# Patient Record
Sex: Male | Born: 1960 | Race: White | Hispanic: No | Smoking: Current every day smoker
Health system: Southern US, Community
[De-identification: ages and names within clinical notes are randomized; demographics above are authoritative.]

## PROBLEM LIST (undated history)

## (undated) DIAGNOSIS — F32A Depression, unspecified: Secondary | ICD-10-CM

---

## 2018-01-12 DIAGNOSIS — U071 COVID-19: Secondary | ICD-10-CM

## 2018-01-12 HISTORY — DX: COVID-19: U07.1

## 2019-01-29 DIAGNOSIS — U071 COVID-19: Secondary | ICD-10-CM | POA: Diagnosis not present

## 2019-02-03 DIAGNOSIS — R05 Cough: Secondary | ICD-10-CM | POA: Diagnosis not present

## 2019-02-03 DIAGNOSIS — U071 COVID-19: Secondary | ICD-10-CM | POA: Diagnosis not present

## 2019-02-03 DIAGNOSIS — R432 Parageusia: Secondary | ICD-10-CM | POA: Diagnosis not present

## 2019-02-03 DIAGNOSIS — R439 Unspecified disturbances of smell and taste: Secondary | ICD-10-CM | POA: Diagnosis not present

## 2019-04-10 ENCOUNTER — Other Ambulatory Visit: Payer: Self-pay

## 2019-04-10 ENCOUNTER — Emergency Department (HOSPITAL_COMMUNITY): Payer: BC Managed Care – PPO

## 2019-04-10 ENCOUNTER — Emergency Department (HOSPITAL_COMMUNITY)
Admission: EM | Admit: 2019-04-10 | Discharge: 2019-04-11 | Disposition: A | Discharge status: Home / Self Care | Payer: BC Managed Care – PPO | Attending: Emergency Medicine | Admitting: Emergency Medicine

## 2019-04-10 ENCOUNTER — Encounter (HOSPITAL_COMMUNITY): Payer: Self-pay

## 2019-04-10 DIAGNOSIS — R55 Syncope and collapse: Secondary | ICD-10-CM | POA: Diagnosis not present

## 2019-04-10 DIAGNOSIS — F17210 Nicotine dependence, cigarettes, uncomplicated: Secondary | ICD-10-CM | POA: Diagnosis not present

## 2019-04-10 DIAGNOSIS — R410 Disorientation, unspecified: Secondary | ICD-10-CM | POA: Diagnosis not present

## 2019-04-10 DIAGNOSIS — F10920 Alcohol use, unspecified with intoxication, uncomplicated: Secondary | ICD-10-CM | POA: Insufficient documentation

## 2019-04-10 DIAGNOSIS — R000 Tachycardia, unspecified: Secondary | ICD-10-CM | POA: Diagnosis not present

## 2019-04-10 DIAGNOSIS — R4182 Altered mental status, unspecified: Secondary | ICD-10-CM | POA: Diagnosis not present

## 2019-04-10 DIAGNOSIS — Y906 Blood alcohol level of 120-199 mg/100 ml: Secondary | ICD-10-CM | POA: Insufficient documentation

## 2019-04-10 DIAGNOSIS — I10 Essential (primary) hypertension: Secondary | ICD-10-CM | POA: Diagnosis not present

## 2019-04-10 DIAGNOSIS — G9340 Encephalopathy, unspecified: Secondary | ICD-10-CM | POA: Diagnosis not present

## 2019-04-10 DIAGNOSIS — F10129 Alcohol abuse with intoxication, unspecified: Secondary | ICD-10-CM | POA: Diagnosis not present

## 2019-04-10 LAB — CBC WITH DIFFERENTIAL/PLATELET
Abs Immature Granulocytes: 0.01 10*3/uL (ref 0.00–0.07)
Basophils Absolute: 0 10*3/uL (ref 0.0–0.1)
Basophils Relative: 1 %
Eosinophils Absolute: 0.1 10*3/uL (ref 0.0–0.5)
Eosinophils Relative: 2 %
HCT: 45.7 % (ref 39.0–52.0)
Hemoglobin: 15.7 g/dL (ref 13.0–17.0)
Immature Granulocytes: 0 %
Lymphocytes Relative: 31 %
Lymphs Abs: 1.1 10*3/uL (ref 0.7–4.0)
MCH: 35.2 pg — ABNORMAL HIGH (ref 26.0–34.0)
MCHC: 34.4 g/dL (ref 30.0–36.0)
MCV: 102.5 fL — ABNORMAL HIGH (ref 80.0–100.0)
Monocytes Absolute: 0.6 10*3/uL (ref 0.1–1.0)
Monocytes Relative: 16 %
Neutro Abs: 1.8 10*3/uL (ref 1.7–7.7)
Neutrophils Relative %: 50 %
Platelets: 189 10*3/uL (ref 150–400)
RBC: 4.46 MIL/uL (ref 4.22–5.81)
RDW: 12.5 % (ref 11.5–15.5)
WBC: 3.6 10*3/uL — ABNORMAL LOW (ref 4.0–10.5)
nRBC: 0 % (ref 0.0–0.2)

## 2019-04-10 LAB — APTT: aPTT: 23 seconds — ABNORMAL LOW (ref 24–36)

## 2019-04-10 LAB — RAPID URINE DRUG SCREEN, HOSP PERFORMED
Amphetamines: NOT DETECTED
Barbiturates: NOT DETECTED
Benzodiazepines: NOT DETECTED
Cocaine: NOT DETECTED
Opiates: NOT DETECTED
Tetrahydrocannabinol: NOT DETECTED

## 2019-04-10 LAB — ETHANOL: Alcohol, Ethyl (B): 137 mg/dL — ABNORMAL HIGH (ref <10)

## 2019-04-10 LAB — COMPREHENSIVE METABOLIC PANEL
ALT: 33 U/L (ref 0–44)
AST: 51 U/L — ABNORMAL HIGH (ref 15–41)
Albumin: 3.9 g/dL (ref 3.5–5.0)
Alkaline Phosphatase: 64 U/L (ref 38–126)
Anion gap: 12 (ref 5–15)
BUN: 8 mg/dL (ref 6–20)
CO2: 24 mmol/L (ref 22–32)
Calcium: 9.9 mg/dL (ref 8.9–10.3)
Chloride: 104 mmol/L (ref 98–111)
Creatinine, Ser: 0.75 mg/dL (ref 0.61–1.24)
GFR calc Af Amer: >60 mL/min (ref >60)
GFR calc non Af Amer: >60 mL/min (ref >60)
Glucose, Bld: 125 mg/dL — ABNORMAL HIGH (ref 70–99)
Potassium: 4.1 mmol/L (ref 3.5–5.1)
Sodium: 140 mmol/L (ref 135–145)
Total Bilirubin: 0.9 mg/dL (ref 0.3–1.2)
Total Protein: 7.1 g/dL (ref 6.5–8.1)

## 2019-04-10 LAB — CBG MONITORING, ED: Glucose-Capillary: 118 mg/dL — ABNORMAL HIGH (ref 70–99)

## 2019-04-10 LAB — URINALYSIS, ROUTINE W REFLEX MICROSCOPIC
Bilirubin Urine: NEGATIVE
Glucose, UA: NEGATIVE mg/dL
Hgb urine dipstick: NEGATIVE
Ketones, ur: NEGATIVE mg/dL
Leukocytes,Ua: NEGATIVE
Nitrite: NEGATIVE
Protein, ur: NEGATIVE mg/dL
Specific Gravity, Urine: 1.021 (ref 1.005–1.030)
pH: 6 (ref 5.0–8.0)

## 2019-04-10 LAB — PROTIME-INR
INR: 1 (ref 0.8–1.2)
Prothrombin Time: 13.1 seconds (ref 11.4–15.2)

## 2019-04-10 LAB — AMMONIA: Ammonia: 33 umol/L (ref 9–35)

## 2019-04-10 MED ORDER — THIAMINE HCL 100 MG/ML IJ SOLN
100.0000 mg | Freq: Once | INTRAMUSCULAR | Status: AC
  Administered 2019-04-10: 16:00:00 100 mg via INTRAVENOUS
  Filled 2019-04-10: qty 2

## 2019-04-10 MED ORDER — SODIUM CHLORIDE 0.9 % IV BOLUS
1000.0000 mL | Freq: Once | INTRAVENOUS | Status: AC
  Administered 2019-04-10: 1000 mL via INTRAVENOUS

## 2019-04-10 MED ORDER — LORAZEPAM 1 MG PO TABS: 0.0000 mg | ORAL_TABLET | Freq: Four times a day (QID) | ORAL | Status: DC

## 2019-04-10 MED ORDER — LORAZEPAM 2 MG/ML IJ SOLN
0.0000 mg | Freq: Four times a day (QID) | INTRAMUSCULAR | Status: DC
  Administered 2019-04-10: 20:00:00 2 mg via INTRAVENOUS
  Filled 2019-04-10: qty 1

## 2019-04-10 MED ORDER — LEVETIRACETAM IN NACL 1000 MG/100ML IV SOLN
1000.0000 mg | Freq: Once | INTRAVENOUS | Status: DC
  Filled 2019-04-10: qty 100

## 2019-04-10 MED ORDER — LORAZEPAM 2 MG/ML IJ SOLN
1.0000 mg | Freq: Once | INTRAMUSCULAR | Status: AC
  Administered 2019-04-10: 18:00:00 1 mg via INTRAVENOUS
  Filled 2019-04-10: qty 1

## 2019-04-10 NOTE — Discharge Instructions (Addendum)
Today you received medications that may make you sleepy or impair your ability to make decisions.  For the next 24 hours please do not drive, operate heavy machinery, care for a small child with out another adult present, or perform any activities that may cause harm to you or someone else if you were to fall asleep or be impaired.   

## 2019-04-10 NOTE — ED Notes (Signed)
Pt to MRI

## 2019-04-10 NOTE — ED Provider Notes (Signed)
On my assessment patient was awake, coherent, no slurred speech.  Able to ambulate on his own.  We discussed his etoh level and my concern for etoh withdrawal, but he is not interested in treatment.  He understands the risks of withdrawal including seizure and death.  He insists "I don't drink that much, just a few beers every day."  I explained it wasn't the quantity of alcohol necessarily but the persistence of daily drinking that leads to dependence and withdrawal issues.  He still does not want treatment at this time.  We'll discharge.  He has a friend coming to pick him up, he tells me.  He will not be driving.     Terald Sleeper, MD 04/10/19 2145

## 2019-04-10 NOTE — ED Triage Notes (Signed)
Per [facility] pt from work, called out for intermittent episodes of confusion. Co worker reports pt told her he didn't feel well. Pt answers "no" t everything. Boss reports he uses ETOH frequently, EMS reports, mild tremors.   BP 152/102 HR 90 RR 18 98% RA 97.9 temp CBG 178  18G LAC

## 2019-04-10 NOTE — ED Provider Notes (Signed)
[facility] EMERGENCY DEPARTMENT Provider Note   CSN: [id number] Arrival date & time: 04/10/19  1329      Level 5 caveat for patient confusion.    History Chief Complaint  Patient presents with  . Altered Mental Status    Peter Fuentes is a 59 y.o. male who has a past medical history of alcohol use who presents today for evaluation of confusion. History primarily obtained from chart review, and patient's boss. Patient reportedly showed up at work at noon and was normal.  His boss reports that at 1230 he was notified that patient was not acting right.  At that time patient was sitting however unable to fully sit upright unsupported.  When they attempted to have him stand he was unable to bear weight on his bilateral legs. He reportedly kept passing out according to his boss that would last for a few seconds.  His speech was not slurred however he did appear confused and saying no repeatedly.  According to his boss there is concern that patient may use alcohol frequently.  Patient states he does not remember these events happening.  Of note he had covid in December.   HPI     Past Medical History:  Diagnosis Date  . COVID-19 01/2018    There are no problems to display for this patient.   History reviewed. No pertinent surgical history.     History reviewed. No pertinent family history.  Social History   Tobacco Use  . Smoking status: Current Every Day Smoker  . Smokeless tobacco: Never Used  Substance Use Topics  . Alcohol use: Yes  . Drug use: Never    Home Medications Prior to Admission medications   Not on File    Allergies    Patient has no known allergies.  Review of Systems   Review of Systems  Unable to perform ROS: Mental status change  All other systems reviewed and are negative.   Physical Exam Updated Vital Signs BP 130/87   Pulse 80   Temp 98 F (36.7 C)   Resp 19   Ht 6' (1.829 m)   Wt 70.3 kg   SpO2 100%    BMI 21.02 kg/m   Physical Exam Vitals and nursing note reviewed.  Constitutional:      General: He is not in acute distress.    Appearance: He is well-developed.  HENT:     Head: Normocephalic and atraumatic.  Eyes:     Conjunctiva/sclera: Conjunctivae normal.  Cardiovascular:     Rate and Rhythm: Normal rate and regular rhythm.     Heart sounds: Normal heart sounds. No murmur.  Pulmonary:     Effort: Pulmonary effort is normal. No respiratory distress.     Breath sounds: Normal breath sounds.  Abdominal:     General: There is no distension.     Palpations: Abdomen is soft.     Tenderness: There is no abdominal tenderness.  Musculoskeletal:     Cervical back: Normal range of motion and neck supple. No rigidity.  Skin:    General: Skin is warm and dry.  Neurological:     Mental Status: He is alert and oriented to person, place, and time.     Cranial Nerves: No cranial nerve deficit.     Sensory: No sensory deficit.     Motor: No weakness.     Comments: Oriented but slow to respond.   Psychiatric:        Mood and Affect:  Mood normal.        Behavior: Behavior normal.     ED Results / Procedures / Treatments   Labs (all labs ordered are listed, but only abnormal results are displayed) Labs Reviewed  COMPREHENSIVE METABOLIC PANEL - Abnormal; Notable for the following components:      Result Value   Glucose, Bld 125 (*)    AST 51 (*)    All other components within normal limits  ETHANOL - Abnormal; Notable for the following components:   Alcohol, Ethyl (B) 137 (*)    All other components within normal limits  CBC WITH DIFFERENTIAL/PLATELET - Abnormal; Notable for the following components:   WBC 3.6 (*)    MCV 102.5 (*)    MCH 35.2 (*)    All other components within normal limits  APTT - Abnormal; Notable for the following components:   aPTT 23 (*)    All other components within normal limits  CBG MONITORING, ED - Abnormal; Notable for the following components:    Glucose-Capillary 118 (*)    All other components within normal limits  AMMONIA  RAPID URINE DRUG SCREEN, HOSP PERFORMED  PROTIME-INR  URINALYSIS, ROUTINE W REFLEX MICROSCOPIC    EKG EKG Interpretation  Date/Time:  Friday April 10 2019 14:35:41 EST Ventricular Rate:  76 PR Interval:  160 QRS Duration: 76 QT Interval:  356 QTC Calculation: 400 R Axis:   89 Text Interpretation: Normal sinus rhythm Septal infarct , age undetermined Abnormal ECG No previous ECGs available Confirmed by Glynn Octave [phone]) on 04/10/2019 2:57:37 PM   Radiology CT Head Wo Contrast  Result Date: 04/10/2019 CLINICAL DATA:  Encephalopathy EXAM: CT HEAD WITHOUT CONTRAST TECHNIQUE: Contiguous axial images were obtained from the base of the skull through the vertex without intravenous contrast. COMPARISON:  None. FINDINGS: Brain: Ventricles and sulci are within normal limits. There is no intracranial mass, hemorrhage, extra-axial fluid collection, or midline shift. There is a small area of decreased attenuation in the anterior limb of the right extreme capsule. Elsewhere brain parenchyma appears unremarkable. Vascular: No hyperdense vessel.  No evident vascular calcification. Skull: Bony calvarium appears intact. Sinuses/Orbits: There is mucosal thickening in each maxillary antrum with retention cysts in each maxillary antrum. There is mucosal thickening in several ethmoid air cells as well as in the anterior left sphenoid sinus. Orbits appear symmetric bilaterally. Other: Mastoid air cells are clear. IMPRESSION: 1. Small focus of decreased attenuation in the anterior limb of the right extreme capsule. This finding likely represents a small age uncertain infarct in this area. No other findings suggesting infarct. No mass or hemorrhage. 2.  Multifocal paranasal sinus disease. Electronically Signed   By: Bretta Bang III M.D.   On: 04/10/2019 14:35   MR BRAIN WO CONTRAST  Result Date: 04/10/2019 CLINICAL  DATA:  Encephalopathy. Abnormal CT scan. Additional history provided: Intermittent confusion. EXAM: MRI HEAD WITHOUT CONTRAST TECHNIQUE: Multiplanar, multiecho pulse sequences of the brain and surrounding structures were obtained without intravenous contrast. COMPARISON:  Head CT 04/10/2019 FINDINGS: Brain: There is no evidence of acute infarct. No evidence of intracranial mass. No midline shift or extra-axial fluid collection. No chronic intracranial blood products. Mild scattered T2/FLAIR hyperintensity within the cerebral white matter and pons is nonspecific, but consistent with chronic small vessel ischemic disease. Mild generalized parenchymal atrophy. Vascular: Flow voids maintained within the proximal large arterial vessels. Skull and upper cervical spine: No focal marrow lesion. Sinuses/Orbits: Visualized orbits demonstrate no acute abnormality. Mild scattered paranasal sinus mucosal thickening.  Moderate-sized mucous retention cysts within the inferior maxillary sinuses bilaterally. IMPRESSION: 1. No evidence of acute intracranial abnormality, including acute infarction. 2. Mild generalized parenchymal atrophy and chronic small vessel ischemic disease. The small focus of hypodensity within the right external capsule described on head CT performed earlier the same day corresponds with a focus of chronic ischemic change. 3. Paranasal sinus disease as described with bilateral maxillary sinus mucous retention cysts. Electronically Signed   By: Kellie Simmering DO   On: 04/10/2019 19:50   DG Chest Port 1 View  Result Date: 04/10/2019 CLINICAL DATA:  Altered mental status EXAM: PORTABLE CHEST 1 VIEW COMPARISON:  2019 FINDINGS: There is hyperinflation with flattening of the diaphragms. Multiple calcified granulomas are identified. No new consolidation or edema. Stable cardiomediastinal contours with normal heart size. Chronic right rib fractures. IMPRESSION: No acute process in the chest. Electronically Signed    By: Macy Mis M.D.   On: 04/10/2019 16:52    Procedures Procedures (including critical care time)  Medications Ordered in ED Medications  LORazepam (ATIVAN) injection 0-4 mg (2 mg Intravenous Given 04/10/19 2024)    Or  LORazepam (ATIVAN) tablet 0-4 mg ( Oral See Alternative 04/10/19 2024)  thiamine (B-1) injection 100 mg (100 mg Intravenous Given 04/10/19 1600)  sodium chloride 0.9 % bolus 1,000 mL (0 mLs Intravenous Stopped 04/10/19 1702)  LORazepam (ATIVAN) injection 1 mg (1 mg Intravenous Given 04/10/19 1809)    ED Course  I have reviewed the triage vital signs and the nursing notes.  Pertinent labs & imaging results that were available during my care of the patient were reviewed by me and considered in my medical decision making (see chart for details).  Clinical Course as of Apr 09 2028  Fri Apr 10, 2019  1339 Attempted to see patient, not in room.    [EH]  [place] called 9-11He came to work and was ok said bill is in the back, was sitting on an empty keg, he was leaning on table, Right hand was shaking, 1230 He "nodded out" a couple of timesHe reports that [employer]" drinks after work but has never shown up to work intoxicated.  He reports that patient lives with his mother, Ms. Lucianne Lei Buren's number is [phone]. LSN was noon.  At one point when they attempted to stand him he was too weak to stand.    [EH]  56 Mother is Mrs Leander Rams [phone]   [EH]  1500 I spoke with Dr. Rory Percy from neurology who suspects that it is more alcohol related and that the CT scan would not account for the confusion earlier.    [EH]  2002 Chronic microvascular changes without evidence of stroke or acute abnormality.  MR BRAIN WO CONTRAST [EH]  2011 I attempted to ambulate patient.  He has shaking in his bilateral arms and legs concerning for withdrawal tremors.  CIWA scoring ordered. And he will be given food and drink.    [EH]    Clinical Course User Index [EH] Ollen Gross   MDM Rules/Calculators/A&P                     Patient presents today for evaluation of confusion. On my exam patient is awake and oriented however he is slow to respond Labs are obtained and reviewed CBC with mild leukopenia at 3.6, do not suspect acute Covid is patient was previously diagnosed in December.  CMP is unremarkable.  Ethanol  is elevated at 137.  PTT is slightly low at 23 however PT/INR pneumonia are normal.  Urine is unremarkable.  UDS is negative.  CT head was obtained showing concern for a small focus of decreased attenuation possibly a infarct of uncertain age. I spoke with Dr. Jerrell Belfast of neurology as patient would be within a stroke window, however he states that that would not cause patient's symptoms.  Patient was allowed time to metabolize, during which his answers normalized and he was no longer slow to answer.  He did develop tremor during this time.  He was treated with Ativan prior to MRI, and given his tremor orders were placed for CIWA scoring. MRI does not show evidence of acute ischemia, rather chronic microvascular changes without evidence of acute infarction. These results were discussed with patient along with need to follow-up with primary care doctor as an outpatient for possible risk factor modification.  I attempted to ambulate patient, however he was unsteady due to tremor which he attributes to not eating. I did discuss with him that this appears to be alcohol withdrawal.  He states that he does not think his alcohol use is a problem and is not interested in drinking.  CIWA scoring was ordered with Ativan.  Patient will need to be evaluated after he is allowed to eat, Ativan, and make sure that he is able to walk without difficulties.  Return precautions were discussed with patient who states their understanding.  At the time of discharge patient denied any unaddressed complaints or concerns.  Patient is agreeable for discharge  home.  Note: Portions of this report may have been transcribed using voice recognition software. Every effort was made to ensure accuracy; however, inadvertent computerized transcription errors may be present  Patient signed out to Dr. Pryor Ochoa with anticipation of discharge home unless patient is unable to ambulate safely or decompensates.    Final Clinical Impression(s) / ED Diagnoses Final diagnoses:  Confusion  Alcoholic intoxication without complication [facility])    Rx / DC Orders ED Discharge Orders    None       Norman Clay 04/10/19 2050    Glynn Octave, MD 04/12/19 [phone]

## 2020-07-06 ENCOUNTER — Emergency Department (HOSPITAL_COMMUNITY): Payer: Self-pay

## 2020-07-06 ENCOUNTER — Emergency Department (HOSPITAL_COMMUNITY)
Admission: EM | Admit: 2020-07-06 | Discharge: 2020-07-08 | Disposition: A | Discharge status: Other Acute Inpatient Hospital | Payer: Self-pay | Attending: Emergency Medicine | Admitting: Emergency Medicine

## 2020-07-06 ENCOUNTER — Encounter (HOSPITAL_COMMUNITY)
Admission: EM | Disposition: A | Discharge status: Other Acute Inpatient Hospital | Payer: Self-pay | Source: Home / Self Care | Attending: Emergency Medicine

## 2020-07-06 ENCOUNTER — Emergency Department (HOSPITAL_COMMUNITY): Payer: Self-pay | Admitting: Anesthesiology

## 2020-07-06 ENCOUNTER — Encounter (HOSPITAL_COMMUNITY): Payer: Self-pay | Admitting: Emergency Medicine

## 2020-07-06 DIAGNOSIS — T1491XA Suicide attempt, initial encounter: Secondary | ICD-10-CM

## 2020-07-06 DIAGNOSIS — X789XXA Intentional self-harm by unspecified sharp object, initial encounter: Secondary | ICD-10-CM | POA: Insufficient documentation

## 2020-07-06 DIAGNOSIS — S66121A Laceration of flexor muscle, fascia and tendon of left index finger at wrist and hand level, initial encounter: Secondary | ICD-10-CM | POA: Insufficient documentation

## 2020-07-06 DIAGNOSIS — S61411A Laceration without foreign body of right hand, initial encounter: Secondary | ICD-10-CM

## 2020-07-06 DIAGNOSIS — S61511A Laceration without foreign body of right wrist, initial encounter: Secondary | ICD-10-CM | POA: Insufficient documentation

## 2020-07-06 DIAGNOSIS — Y939 Activity, unspecified: Secondary | ICD-10-CM | POA: Insufficient documentation

## 2020-07-06 DIAGNOSIS — S61412A Laceration without foreign body of left hand, initial encounter: Secondary | ICD-10-CM

## 2020-07-06 DIAGNOSIS — F32A Depression, unspecified: Secondary | ICD-10-CM | POA: Insufficient documentation

## 2020-07-06 DIAGNOSIS — W19XXXA Unspecified fall, initial encounter: Secondary | ICD-10-CM

## 2020-07-06 DIAGNOSIS — S6412XA Injury of median nerve at wrist and hand level of left arm, initial encounter: Secondary | ICD-10-CM | POA: Insufficient documentation

## 2020-07-06 DIAGNOSIS — Z8616 Personal history of COVID-19: Secondary | ICD-10-CM | POA: Insufficient documentation

## 2020-07-06 DIAGNOSIS — F322 Major depressive disorder, single episode, severe without psychotic features: Secondary | ICD-10-CM | POA: Insufficient documentation

## 2020-07-06 DIAGNOSIS — S61512A Laceration without foreign body of left wrist, initial encounter: Secondary | ICD-10-CM

## 2020-07-06 DIAGNOSIS — Z23 Encounter for immunization: Secondary | ICD-10-CM | POA: Insufficient documentation

## 2020-07-06 HISTORY — PX: I & D EXTREMITY: SHX5045

## 2020-07-06 HISTORY — DX: Depression, unspecified: F32.A

## 2020-07-06 LAB — CBC
HCT: 46.5 % (ref 39.0–52.0)
Hemoglobin: 16.2 g/dL (ref 13.0–17.0)
MCH: 35.5 pg — ABNORMAL HIGH (ref 26.0–34.0)
MCHC: 34.8 g/dL (ref 30.0–36.0)
MCV: 102 fL — ABNORMAL HIGH (ref 80.0–100.0)
Platelets: 132 10*3/uL — ABNORMAL LOW (ref 150–400)
RBC: 4.56 MIL/uL (ref 4.22–5.81)
RDW: 11.7 % (ref 11.5–15.5)
WBC: 4.2 10*3/uL (ref 4.0–10.5)
nRBC: 0 % (ref 0.0–0.2)

## 2020-07-06 LAB — COMPREHENSIVE METABOLIC PANEL
ALT: 104 U/L — ABNORMAL HIGH (ref 0–44)
AST: 97 U/L — ABNORMAL HIGH (ref 15–41)
Albumin: 4.1 g/dL (ref 3.5–5.0)
Alkaline Phosphatase: 74 U/L (ref 38–126)
Anion gap: 13 (ref 5–15)
BUN: 6 mg/dL (ref 6–20)
CO2: 21 mmol/L — ABNORMAL LOW (ref 22–32)
Calcium: 9.6 mg/dL (ref 8.9–10.3)
Chloride: 101 mmol/L (ref 98–111)
Creatinine, Ser: 0.65 mg/dL (ref 0.61–1.24)
GFR, Estimated: >60 mL/min (ref >60)
Glucose, Bld: 81 mg/dL (ref 70–99)
Potassium: 4.3 mmol/L (ref 3.5–5.1)
Sodium: 135 mmol/L (ref 135–145)
Total Bilirubin: 1.3 mg/dL — ABNORMAL HIGH (ref 0.3–1.2)
Total Protein: 7.3 g/dL (ref 6.5–8.1)

## 2020-07-06 LAB — CK: Total CK: 90 U/L (ref 49–397)

## 2020-07-06 LAB — RAPID URINE DRUG SCREEN, HOSP PERFORMED
Amphetamines: NOT DETECTED
Barbiturates: NOT DETECTED
Benzodiazepines: NOT DETECTED
Cocaine: NOT DETECTED
Opiates: POSITIVE — AB
Tetrahydrocannabinol: NOT DETECTED

## 2020-07-06 LAB — ETHANOL: Alcohol, Ethyl (B): 210 mg/dL — ABNORMAL HIGH (ref <10)

## 2020-07-06 LAB — RESP PANEL BY RT-PCR (FLU A&B, COVID) ARPGX2
Influenza A by PCR: NEGATIVE
Influenza B by PCR: NEGATIVE
SARS Coronavirus 2 by RT PCR: NEGATIVE

## 2020-07-06 LAB — ACETAMINOPHEN LEVEL: Acetaminophen (Tylenol), Serum: <10 ug/mL — ABNORMAL LOW (ref 10–30)

## 2020-07-06 LAB — SALICYLATE LEVEL: Salicylate Lvl: <7 mg/dL — ABNORMAL LOW (ref 7.0–30.0)

## 2020-07-06 SURGERY — IRRIGATION AND DEBRIDEMENT EXTREMITY
Anesthesia: General | Laterality: Bilateral

## 2020-07-06 MED ORDER — FENTANYL CITRATE (PF) 250 MCG/5ML IJ SOLN
INTRAMUSCULAR | Status: AC
  Filled 2020-07-06: qty 5

## 2020-07-06 MED ORDER — PROPOFOL 10 MG/ML IV BOLUS
INTRAVENOUS | Status: AC
  Filled 2020-07-06: qty 20

## 2020-07-06 MED ORDER — CHLORHEXIDINE GLUCONATE 4 % EX LIQD: 60.0000 mL | Freq: Once | CUTANEOUS | Status: DC

## 2020-07-06 MED ORDER — LORAZEPAM 2 MG/ML IJ SOLN: 0.0000 mg | Freq: Two times a day (BID) | INTRAMUSCULAR | Status: DC

## 2020-07-06 MED ORDER — PROPOFOL 10 MG/ML IV BOLUS
INTRAVENOUS | Status: DC | PRN
  Administered 2020-07-06: 170 mg via INTRAVENOUS

## 2020-07-06 MED ORDER — OXYCODONE HCL 5 MG PO TABS: 5.0000 mg | ORAL_TABLET | Freq: Once | ORAL | Status: DC | PRN

## 2020-07-06 MED ORDER — CEFAZOLIN SODIUM-DEXTROSE 2-4 GM/100ML-% IV SOLN
2.0000 g | INTRAVENOUS | Status: AC
  Administered 2020-07-06: 2 g via INTRAVENOUS

## 2020-07-06 MED ORDER — LACTATED RINGERS IV SOLN: INTRAVENOUS | Status: DC | PRN

## 2020-07-06 MED ORDER — CHLORHEXIDINE GLUCONATE 0.12 % MT SOLN
OROMUCOSAL | Status: AC
  Administered 2020-07-06: 15 mL
  Filled 2020-07-06: qty 15

## 2020-07-06 MED ORDER — LORAZEPAM 1 MG PO TABS: 0.0000 mg | ORAL_TABLET | Freq: Two times a day (BID) | ORAL | Status: DC

## 2020-07-06 MED ORDER — LORAZEPAM 1 MG PO TABS: 0.0000 mg | ORAL_TABLET | Freq: Four times a day (QID) | ORAL | Status: AC

## 2020-07-06 MED ORDER — DEXAMETHASONE SODIUM PHOSPHATE 10 MG/ML IJ SOLN
INTRAMUSCULAR | Status: DC | PRN
  Administered 2020-07-06: 10 mg via INTRAVENOUS

## 2020-07-06 MED ORDER — LORAZEPAM 2 MG/ML IJ SOLN: 0.0000 mg | Freq: Four times a day (QID) | INTRAMUSCULAR | Status: AC

## 2020-07-06 MED ORDER — BUPIVACAINE-EPINEPHRINE (PF) 0.25% -1:200000 IJ SOLN
INTRAMUSCULAR | Status: DC | PRN
  Administered 2020-07-06 (×2): 10 mL

## 2020-07-06 MED ORDER — CEFAZOLIN SODIUM-DEXTROSE 2-4 GM/100ML-% IV SOLN
INTRAVENOUS | Status: AC
  Filled 2020-07-06: qty 100

## 2020-07-06 MED ORDER — TETANUS-DIPHTH-ACELL PERTUSSIS 5-2.5-18.5 LF-MCG/0.5 IM SUSY
0.5000 mL | PREFILLED_SYRINGE | Freq: Once | INTRAMUSCULAR | Status: AC
  Administered 2020-07-06: 0.5 mL via INTRAMUSCULAR
  Filled 2020-07-06: qty 0.5

## 2020-07-06 MED ORDER — CEFAZOLIN SODIUM-DEXTROSE 2-4 GM/100ML-% IV SOLN
2.0000 g | INTRAVENOUS | Status: AC
  Administered 2020-07-06: 2 g via INTRAVENOUS
  Filled 2020-07-06: qty 100

## 2020-07-06 MED ORDER — IBUPROFEN 800 MG PO TABS: 800.0000 mg | ORAL_TABLET | Freq: Once | ORAL | Status: DC

## 2020-07-06 MED ORDER — PHENYLEPHRINE HCL (PRESSORS) 10 MG/ML IV SOLN
INTRAVENOUS | Status: DC | PRN
  Administered 2020-07-06 (×2): 80 ug via INTRAVENOUS
  Administered 2020-07-06: 120 ug via INTRAVENOUS

## 2020-07-06 MED ORDER — MORPHINE SULFATE (PF) 4 MG/ML IV SOLN
4.0000 mg | Freq: Once | INTRAVENOUS | Status: AC
  Administered 2020-07-06: 4 mg via INTRAVENOUS
  Filled 2020-07-06: qty 1

## 2020-07-06 MED ORDER — FENTANYL CITRATE (PF) 250 MCG/5ML IJ SOLN
INTRAMUSCULAR | Status: DC | PRN
  Administered 2020-07-06: 100 ug via INTRAVENOUS
  Administered 2020-07-06: 25 ug via INTRAVENOUS

## 2020-07-06 MED ORDER — OXYCODONE HCL 5 MG/5ML PO SOLN: 5.0000 mg | Freq: Once | ORAL | Status: DC | PRN

## 2020-07-06 MED ORDER — ONDANSETRON HCL 4 MG/2ML IJ SOLN
INTRAMUSCULAR | Status: DC | PRN
  Administered 2020-07-06: 4 mg via INTRAVENOUS

## 2020-07-06 MED ORDER — HYDROMORPHONE HCL 1 MG/ML IJ SOLN
0.2500 mg | INTRAMUSCULAR | Status: DC | PRN
Start: 2020-07-06 — End: 2020-07-06

## 2020-07-06 MED ORDER — MIDAZOLAM HCL 2 MG/2ML IJ SOLN
INTRAMUSCULAR | Status: AC
  Filled 2020-07-06: qty 2

## 2020-07-06 MED ORDER — 0.9 % SODIUM CHLORIDE (POUR BTL) OPTIME
TOPICAL | Status: DC | PRN
  Administered 2020-07-06 (×2): 1000 mL

## 2020-07-06 MED ORDER — MIDAZOLAM HCL 2 MG/2ML IJ SOLN
INTRAMUSCULAR | Status: DC | PRN
  Administered 2020-07-06: 2 mg via INTRAVENOUS

## 2020-07-06 MED ORDER — THIAMINE HCL 100 MG PO TABS
100.0000 mg | ORAL_TABLET | Freq: Every day | ORAL | Status: DC
  Administered 2020-07-06 – 2020-07-08 (×3): 100 mg via ORAL
  Filled 2020-07-06 (×3): qty 1

## 2020-07-06 MED ORDER — POVIDONE-IODINE 10 % EX SWAB
2.0000 "application " | Freq: Once | CUTANEOUS | Status: AC
  Administered 2020-07-06: 2 via TOPICAL

## 2020-07-06 MED ORDER — EPHEDRINE SULFATE 50 MG/ML IJ SOLN
INTRAMUSCULAR | Status: DC | PRN
  Administered 2020-07-06 (×2): 10 mg via INTRAVENOUS

## 2020-07-06 MED ORDER — LIDOCAINE HCL (CARDIAC) PF 100 MG/5ML IV SOSY
PREFILLED_SYRINGE | INTRAVENOUS | Status: DC | PRN
  Administered 2020-07-06: 60 mg via INTRAVENOUS

## 2020-07-06 MED ORDER — THIAMINE HCL 100 MG/ML IJ SOLN: 100.0000 mg | Freq: Every day | INTRAMUSCULAR | Status: DC

## 2020-07-06 MED ORDER — ONDANSETRON HCL 4 MG/2ML IJ SOLN: 4.0000 mg | Freq: Once | INTRAMUSCULAR | Status: DC | PRN

## 2020-07-06 SURGICAL SUPPLY — 39 items
BLADE SURG 15 STRL LF DISP TIS (BLADE) ×2 IMPLANT
BLADE SURG 15 STRL SS (BLADE) ×4
BNDG ELASTIC 3X5.8 VLCR STR LF (GAUZE/BANDAGES/DRESSINGS) ×2 IMPLANT
BNDG ELASTIC 4X5.8 VLCR STR LF (GAUZE/BANDAGES/DRESSINGS) ×2 IMPLANT
BNDG GAUZE ELAST 4 BULKY (GAUZE/BANDAGES/DRESSINGS) ×6 IMPLANT
COVER SURGICAL LIGHT HANDLE (MISCELLANEOUS) ×2 IMPLANT
CUFF TOURN SGL QUICK 18X4 (TOURNIQUET CUFF) ×2 IMPLANT
DRAPE EXTREMITY T 121X128X90 (DISPOSABLE) ×4 IMPLANT
DRSG XEROFORM 1X8 (GAUZE/BANDAGES/DRESSINGS) ×4 IMPLANT
ELECT CAUTERY BLADE 6.4 (BLADE) ×2 IMPLANT
ELECT REM PT RETURN 9FT ADLT (ELECTROSURGICAL) ×2
ELECTRODE REM PT RTRN 9FT ADLT (ELECTROSURGICAL) ×1 IMPLANT
GAUZE SPONGE 4X4 12PLY STRL (GAUZE/BANDAGES/DRESSINGS) ×6 IMPLANT
GLOVE BIOGEL M STRL SZ7.5 (GLOVE) ×4 IMPLANT
GLOVE SRG 8 PF TXTR STRL LF DI (GLOVE) ×1 IMPLANT
GLOVE SURG UNDER POLY LF SZ8 (GLOVE) ×2
GOWN STRL REUS W/ TWL LRG LVL3 (GOWN DISPOSABLE) ×2 IMPLANT
GOWN STRL REUS W/ TWL XL LVL3 (GOWN DISPOSABLE) ×1 IMPLANT
GOWN STRL REUS W/TWL LRG LVL3 (GOWN DISPOSABLE) ×4
GOWN STRL REUS W/TWL XL LVL3 (GOWN DISPOSABLE) ×2
KIT BASIN OR (CUSTOM PROCEDURE TRAY) ×2 IMPLANT
KIT TURNOVER KIT A (KITS) ×2 IMPLANT
MANIFOLD NEPTUNE II (INSTRUMENTS) ×2 IMPLANT
NS IRRIG 1000ML POUR BTL (IV SOLUTION) ×4 IMPLANT
PACK GENERAL/GYN (CUSTOM PROCEDURE TRAY) ×2 IMPLANT
PADDING CAST ABS 3INX4YD NS (CAST SUPPLIES) ×1
PADDING CAST ABS COTTON 3X4 (CAST SUPPLIES) ×1 IMPLANT
SLEEVE SCD COMPRESS KNEE MED (STOCKING) ×2 IMPLANT
SOLUTION BETADINE 4OZ (MISCELLANEOUS) ×4 IMPLANT
SPLINT FIBERGLASS 3X12 (CAST SUPPLIES) ×2 IMPLANT
STOCKINETTE 6  STRL (DRAPES) ×4
STOCKINETTE 6 STRL (DRAPES) ×2 IMPLANT
SUT ETHIBOND 3 0 SH 1 (SUTURE) ×2 IMPLANT
SUT ETHIBOND 4 0 TF (SUTURE) ×2 IMPLANT
SUT ETHILON 7 0 P 1 (SUTURE) ×4 IMPLANT
SUT MON AB 4-0 PC3 18 (SUTURE) ×6 IMPLANT
SYR CONTROL 10ML LL (SYRINGE) ×4 IMPLANT
TOWEL GREEN STERILE (TOWEL DISPOSABLE) ×2 IMPLANT
TOWEL GREEN STERILE FF (TOWEL DISPOSABLE) ×2 IMPLANT

## 2020-07-06 NOTE — ED Notes (Signed)
Mother Doree Albee [phone] would like an update on the patient

## 2020-07-06 NOTE — Transfer of Care (Signed)
Immediate Anesthesia Transfer of Care Note  Patient: Kainalu Heggs  Procedure(s) Performed: IRRIGATION AND DEBRIDEMENT OF RIGHT WRIST WITH COMPLEX CLOSURE , IRRIGATION AND DEBRIDEMENT OF LEFT WRIST WITH MEDIAN NERVE REPAIR, TENDON REPAIR X2, COMPLEX CLOSURE (Bilateral )  Patient Location: PACU  Anesthesia Type:General  Level of Consciousness: awake, alert  and oriented  Airway & Oxygen Therapy: Patient Spontanous Breathing  Post-op Assessment: Report given to RN and Post -op Vital signs reviewed and stable  Post vital signs: Reviewed and stable  Last Vitals:  Vitals Value Taken Time  BP 156/92 07/06/20 2051  Temp    Pulse 91 07/06/20 2052  Resp 16 07/06/20 2052  SpO2 100 % 07/06/20 2052  Vitals shown include unvalidated device data.  Last Pain:  Vitals:   07/06/20 1801  TempSrc: Oral  PainSc: 5          Complications: No complications documented.

## 2020-07-06 NOTE — ED Triage Notes (Signed)
Patient BIB [facility] after mother found patient lying face down on bed with lacerations to bilateral wrists and a razor blade laying on the bed. Hemorrhage controlled. Patient alert, oriented, and in no apparent distress at this time.

## 2020-07-06 NOTE — ED Notes (Signed)
Pt reports last drink of alcohol was 2 days ago but denies everyday use

## 2020-07-06 NOTE — Progress Notes (Signed)
Per Dr. Arita Miss, okay for patient to return to ED post surgery.

## 2020-07-06 NOTE — Interval H&P Note (Signed)
History and Physical Interval Note:  07/06/2020 6:44 PM  Peter Fuentes  has presented today for surgery, with the diagnosis of FALL.  The various methods of treatment have been discussed with the patient and family. After consideration of risks, benefits and other options for treatment, the patient has consented to  Procedure(s): IRRIGATION AND DEBRIDEMENT OF WRIST, REPAIR AS NECESSARY (Bilateral) as a surgical intervention.  The patient's history has been reviewed, patient examined, no change in status, stable for surgery.  I have reviewed the patient's chart and labs.  Questions were answered to the patient's satisfaction.     Allena Napoleon

## 2020-07-06 NOTE — Op Note (Signed)
Operative Note   DATE OF OPERATION: 07/06/2020  SURGICAL DEPARTMENT: Plastic Surgery  PREOPERATIVE DIAGNOSES: Bilateral wrist lacerations  POSTOPERATIVE DIAGNOSES:  same  PROCEDURE: 1.  Exploration right wrist laceration with complex repair totaling 8 cm in length 2.  Repair of partial transection of left median nerve 3.  Repair of left palmaris longus tendon 4.  Repair of partial transection of left flexor carpi radialis tendon 5.  Complex repair left wrist totaling 12 cm in length  SURGEON: Ancil Linsey, MD  ASSISTANT: None  ANESTHESIA:  General.   COMPLICATIONS: None.   INDICATIONS FOR PROCEDURE:  The patient, Peter Fuentes is a 60 y.o. male born on 24-Jan-1961, is here for treatment of bilateral wrist lacerations that were self-inflicted MRN: [id number]  CONSENT:  Informed consent was obtained directly from the patient. Risks, benefits and alternatives were fully discussed. Specific risks including but not limited to bleeding, infection, hematoma, seroma, scarring, pain, contracture, asymmetry, wound healing problems, and need for further surgery were all discussed. The patient did have an ample opportunity to have questions answered to satisfaction.   DESCRIPTION OF PROCEDURE:  The patient was taken to the operating room. SCDs were placed and antibiotics were given.  General anesthesia was administered.  The patient's operative site was prepped and draped in a sterile fashion. A time out was performed and all information was confirmed to be correct.  I started by evaluating that right wrist lacerations.  Marcaine with epinephrine was injected.  On exploration there was very subtle fraying of the palmaris longus and FCR tendons totaling less than 10% of the tendon diameter.  The remainder of the deep forearm fascia was not violated.  At this point the transverse lacerations were irrigated with saline.  Devitalized tissue was debrided with pickups and scissors.  The skin was  then advanced and closed in layers with interrupted buried 4-0 Monocryl sutures and a running Monocryl.  Closure on the right side total 8 cm in length.  A soft dressing was then applied.  I then turned my attention to the left side.  The arm was exsanguinated with gravity and tourniquet was inflated to 250 mmHg.  There were 3 transverse lacerations that were full-thickness.  They were about 2 cm apart starting approximately 3 to 4 cm proximal to the wrist flexor crease.  There was clear transection of the palmaris longus tendon.  I ended up extending the incision proximally and distally in that area for better exposure.  I was able to identify a partial transection of the median nerve.  The flexor carpi radialis was approximately 50% lacerated.  The flexor carpi all naris was totally intact.  It appeared that the radial and ulnar vessels were undisturbed and the ulnar nerve was inspected as well at the location of the wound and appeared unharmed.  Flexor digitorum superficialis tendons were all intact although there was some fraying of the FDS muscle bellies in this location.  He clinically had full function of FDP and FDS tendons preoperatively when they were tested independently.  At this point the wound was irrigated copiously with saline.  The median nerve was repaired with interrupted 7-0 nylon sutures.  The palmaris longus was repaired with 4-0 Ethibond sutures utilizing a modified Kessler followed by a figure-of-eight suture.  The partial transection of the FCR was repaired with 2 figure-of-eight 3-0 Ethibond sutures.  All devitalized skin was then debrided with pickups and scissors.  The skin was then advanced and closed in layers with interrupted  buried 4-0 Monocryl sutures and running Monocryl.  Total length of closure on the left side was 12 cm.  Marcaine with epinephrine was then injected circumferentially for local anesthetic.  A dorsal splint was then applied to protect against any wrist extension.   Tourniquet was let down tourniquet time was about 38 minutes.  The patient tolerated the procedure well.  There were no complications. The patient was allowed to wake from anesthesia, extubated and taken to the recovery room in satisfactory condition.

## 2020-07-06 NOTE — ED Provider Notes (Signed)
[facility] EMERGENCY DEPARTMENT Provider Note   CSN: [id number] Arrival date & time: 07/06/20  2876    History Chief Complaint  Patient presents with  . Suicide Attempt    Peter Fuentes is a 60 y.o. male with past medical history significant for depression who presents for evaluation of possible suicide attempt.  Found on floor by family member.  Patient has significant lacerations to bilateral volar wrist. When asked how he got this patient states  "I dont know, I dont know." when asked about illicit substances patient states "No." Does not answer when asked about alcohol. Admits to pain to bilateral wrist. Left hand dominant.    Smells of alcohol   He appears somnolent on exam however is protecting his airway.  Level 5- caveat AMS  No emergency contact in Epic. Attempted home phone however no answer.  Per chart review has been seen here for EtOh intoxication previously.    Patient gave permission to discuss with Mother Burnadette Pop at [phone]  States patient with increasing depression. No prior hx of SA. Does occasionally drink Alcohol. No prior hx of DT, WD seizures. Patient has been upset that family is moving to [place] and patient does not want to move. He currently lives with his parents.       HPI     Past Medical History:  Diagnosis Date  . COVID-19 01/2018  . Depression     There are no problems to display for this patient.   No past surgical history on file.     No family history on file.  Social History   Tobacco Use  . Smoking status: Current Every Day Smoker  . Smokeless tobacco: Never Used  Substance Use Topics  . Alcohol use: Yes  . Drug use: Never    Home Medications Prior to Admission medications   Not on File    Allergies    Patient has no known allergies.  Review of Systems   Review of Systems  Unable to perform ROS: Mental status change  Skin: Positive for wound.  All other systems  reviewed and are negative.   Physical Exam Updated Vital Signs BP 98/63   Pulse 84   Temp 98 F (36.7 C) (Oral)   Resp 16   SpO2 99%   Physical Exam Vitals and nursing note reviewed.  Constitutional:      General: He is not in acute distress.    Appearance: He is well-developed. He is not ill-appearing, toxic-appearing or diaphoretic.     Comments: Sleeping, arousal to voice, Disheveled, smells of EtOh  HENT:     Head: Normocephalic and atraumatic.     Nose: Nose normal.     Mouth/Throat:     Mouth: Mucous membranes are moist.     Comments: Tongue midline Eyes:     Pupils: Pupils are equal, round, and reactive to light.     Comments: PERRLA  Neck:     Comments: Full ROM without difficulty Cardiovascular:     Rate and Rhythm: Normal rate and regular rhythm.     Pulses: Normal pulses.     Heart sounds: Normal heart sounds.  Pulmonary:     Effort: Pulmonary effort is normal. No respiratory distress.     Breath sounds: Normal breath sounds.  Abdominal:     General: Bowel sounds are normal. There is no distension.     Palpations: Abdomen is soft.     Tenderness: There is no abdominal tenderness. There  is no right CVA tenderness, left CVA tenderness or guarding.  Musculoskeletal:        General: Normal range of motion.     Cervical back: Normal range of motion and neck supple.     Comments: Pelvis stable, nontender palpation.  No shortening rotation of legs.  No tenderness of bilateral lower extremities. Tenderness to lateral distal forearm volar.  Large lacerations. Able to extends at Bl wrist. Can flex however difficult due to intoxication.  Skin:    General: Skin is warm and dry.     Capillary Refill: Capillary refill takes less than 2 seconds.     Comments: Multiple, deep lacerations to bilateral volar distal forearms. Largest 6 cm. There is visible exposed lacerated tendon to his left wound.  Neurological:     Comments: Sleepy, arousable to voice No facial  droop Equal hand grip bilaterally however with weakness bilaterally likely due to pain         ED Results / Procedures / Treatments   Labs (all labs ordered are listed, but only abnormal results are displayed) Labs Reviewed  COMPREHENSIVE METABOLIC PANEL - Abnormal; Notable for the following components:      Result Value   CO2 21 (*)    AST 97 (*)    ALT 104 (*)    Total Bilirubin 1.3 (*)    All other components within normal limits  ETHANOL - Abnormal; Notable for the following components:   Alcohol, Ethyl (B) 210 (*)    All other components within normal limits  SALICYLATE LEVEL - Abnormal; Notable for the following components:   Salicylate Lvl <3.6 (*)    All other components within normal limits  ACETAMINOPHEN LEVEL - Abnormal; Notable for the following components:   Acetaminophen (Tylenol), Serum <10 (*)    All other components within normal limits  CBC - Abnormal; Notable for the following components:   MCV 102.0 (*)    MCH 35.5 (*)    Platelets 132 (*)    All other components within normal limits  RESP PANEL BY RT-PCR (FLU A&B, COVID) ARPGX2  CK  RAPID URINE DRUG SCREEN, HOSP PERFORMED    EKG EKG Interpretation  Date/Time:  Wednesday Jul 06 2020 09:23:22 EDT Ventricular Rate:  76 PR Interval:  150 QRS Duration: 93 QT Interval:  369 QTC Calculation: 415 R Axis:   85 Text Interpretation: Sinus rhythm Anteroseptal infarct, age indeterminate ST elevation, consider inferior injury Lateral leads are also involved No significant change since last tracing Confirmed by Isla Pence [phone]) on 07/06/2020 11:42:19 AM   Radiology DG Chest 1 View  Result Date: 07/06/2020 CLINICAL DATA:  Suicide attempt. EXAM: CHEST  1 VIEW COMPARISON:  04/10/2019 FINDINGS: Heart size and vascularity normal. Calcified granulomata in the right mid and lower lobe and in the right hilum unchanged. Lungs are otherwise clear without infiltrate or effusion. No pneumothorax. Chronic right  rib fractures. IMPRESSION: No active disease. Electronically Signed   By: Franchot Gallo M.D.   On: 07/06/2020 10:28   DG Pelvis 1-2 Views  Result Date: 07/06/2020 CLINICAL DATA:  Suicide attempt.  Lacerations. EXAM: PELVIS - 1-2 VIEW COMPARISON:  None. FINDINGS: There is no evidence of pelvic fracture or diastasis. No pelvic bone lesions are seen. IMPRESSION: Negative. Electronically Signed   By: Franchot Gallo M.D.   On: 07/06/2020 10:26   DG Forearm Left  Result Date: 07/06/2020 CLINICAL DATA:  Suicide attempt, laceration to distal forearms EXAM: LEFT FOREARM - 2 VIEW COMPARISON:  Concurrently obtained radiographs of the right forearm FINDINGS: No acute fracture, malalignment or evidence of retained radiopaque foreign body. Soft tissue irregularity overlying the distal wrist consistent with the clinical history of laceration. No osseous abnormality. IMPRESSION: Soft tissue laceration without evidence of bony involvement or retained radiopaque foreign body. Electronically Signed   By: Jacqulynn Cadet M.D.   On: 07/06/2020 10:28   DG Forearm Right  Result Date: 07/06/2020 CLINICAL DATA:  Suicide attempt.  Lacerations EXAM: RIGHT FOREARM - 2 VIEW COMPARISON:  None. FINDINGS: There is no evidence of fracture or other focal bone lesions. Soft tissues are unremarkable. No foreign body. IMPRESSION: Negative. Electronically Signed   By: Franchot Gallo M.D.   On: 07/06/2020 10:27   CT Head Wo Contrast  Result Date: 07/06/2020 CLINICAL DATA:  Facial trauma.  Delirium. EXAM: CT HEAD WITHOUT CONTRAST CT CERVICAL SPINE WITHOUT CONTRAST TECHNIQUE: Multidetector CT imaging of the head and cervical spine was performed following the standard protocol without intravenous contrast. Multiplanar CT image reconstructions of the cervical spine were also generated. COMPARISON:  CT head 04/10/2019 FINDINGS: CT HEAD FINDINGS Brain: No evidence of acute infarction, hemorrhage, hydrocephalus, extra-axial collection or mass  lesion/mass effect. Diffuse cortical atrophy. Periventricular white matter hypodensity is a nonspecific finding, but most commonly relates to chronic ischemic small vessel disease. Vascular: No hyperdense vessel or unexpected calcification. Skull: Normal. Negative for fracture or focal lesion. Sinuses/Orbits: Partial opacification of the maxillary sinuses. Other: None. CT CERVICAL SPINE FINDINGS Alignment: Within normal limits. Skull base and vertebrae: No acute fracture. No primary bone lesion or focal pathologic process. Soft tissues and spinal canal: No prevertebral fluid or swelling. No visible canal hematoma. Disc levels: Mild disc height loss at C5-C6. Advanced facet degenerative changes seen throughout the cervical spine. Degenerative changes are seen throughout the cervical spine with greatest disease burden at C6-C7 with posterior spondylotic ridge and bulky facet arthropathy causing severe bilateral foraminal stenosis. Upper chest: Mild emphysematous changes seen in the upper lungs. Other: None IMPRESSION: 1. No acute intracranial abnormality. 2. No acute fracture or dislocation of the cervical and visualized upper thoracic spine. 3. Advanced multilevel degenerative changes of the cervical spine. Electronically Signed   By: Miachel Roux M.D.   On: 07/06/2020 11:26   CT Cervical Spine Wo Contrast  Result Date: 07/06/2020 CLINICAL DATA:  Facial trauma.  Delirium. EXAM: CT HEAD WITHOUT CONTRAST CT CERVICAL SPINE WITHOUT CONTRAST TECHNIQUE: Multidetector CT imaging of the head and cervical spine was performed following the standard protocol without intravenous contrast. Multiplanar CT image reconstructions of the cervical spine were also generated. COMPARISON:  CT head 04/10/2019 FINDINGS: CT HEAD FINDINGS Brain: No evidence of acute infarction, hemorrhage, hydrocephalus, extra-axial collection or mass lesion/mass effect. Diffuse cortical atrophy. Periventricular white matter hypodensity is a nonspecific  finding, but most commonly relates to chronic ischemic small vessel disease. Vascular: No hyperdense vessel or unexpected calcification. Skull: Normal. Negative for fracture or focal lesion. Sinuses/Orbits: Partial opacification of the maxillary sinuses. Other: None. CT CERVICAL SPINE FINDINGS Alignment: Within normal limits. Skull base and vertebrae: No acute fracture. No primary bone lesion or focal pathologic process. Soft tissues and spinal canal: No prevertebral fluid or swelling. No visible canal hematoma. Disc levels: Mild disc height loss at C5-C6. Advanced facet degenerative changes seen throughout the cervical spine. Degenerative changes are seen throughout the cervical spine with greatest disease burden at C6-C7 with posterior spondylotic ridge and bulky facet arthropathy causing severe bilateral foraminal stenosis. Upper chest: Mild emphysematous changes seen  in the upper lungs. Other: None IMPRESSION: 1. No acute intracranial abnormality. 2. No acute fracture or dislocation of the cervical and visualized upper thoracic spine. 3. Advanced multilevel degenerative changes of the cervical spine. Electronically Signed   By: Miachel Roux M.D.   On: 07/06/2020 11:26    Procedures Procedures   Medications Ordered in ED Medications  LORazepam (ATIVAN) injection 0-4 mg (has no administration in time range)    Or  LORazepam (ATIVAN) tablet 0-4 mg (has no administration in time range)  LORazepam (ATIVAN) injection 0-4 mg (has no administration in time range)    Or  LORazepam (ATIVAN) tablet 0-4 mg (has no administration in time range)  thiamine tablet 100 mg (has no administration in time range)    Or  thiamine (B-1) injection 100 mg (has no administration in time range)  ceFAZolin (ANCEF) IVPB 2g/100 mL premix (0 g Intravenous Stopped 07/06/20 1136)  Tdap (BOOSTRIX) injection 0.5 mL (0.5 mLs Intramuscular Given 07/06/20 1102)  morphine 4 MG/ML injection 4 mg (4 mg Intravenous Given 07/06/20 1242)     ED Course  I have reviewed the triage vital signs and the nursing notes.  Pertinent labs & imaging results that were available during my care of the patient were reviewed by me and considered in my medical decision making (see chart for details).  60 year old here for evaluation of suicide attempt.  He is afebrile, nonseptic appearing.  Patient appears visibly intoxicated, smells of alcohol.  He has a large, several deep lacerations to bilateral distal tib-fib at volar surface.  There is obvious exposed tendon to the left laceration which is lacerated.  He is neurovascularly intact.  No obvious pulsatile bleeding.  Given large laceration with exposed tendon, will start on Ancef, update Tdap.  Will plan on labs and imaging.   Labs and imaging personally reviewed and interpreted:  CBC without leukocytosis  Metabolic panel to bili 1.3, AST, ALT elevation however no alk phos.  Patient has benign abdominal exam, likely due to EtOH use Ethanol 210 Acetaminophen, salicylate within normal limits CK 90 UDS COVID DG chest without acute abnormality  DF left forearm with lac, no fracture, dislocation DG right forearm no fracture, dislocation DG pelvis without significant abnormality CT head without acute abnormality CT cervical wo acute abnormality  Patient reassessed. Sleepy however arousal to voice.  CONSULT with Silvestre Gunner who will touch base with hand surgery to determine suture here in ED vs OR.  Patient seen by hand surgery, will take to the OR to close wounds and repair tendon laceration.  Hand surgery says this is an outpatient procedure.  Patient does not need admission to the hospital for any orthopedic reason. Patient does not have medical reason for admission. Will be brought back down here to the ED after completion of surgery to be assessed by Psychiatry for Suicide attempt. Psych hold orders placed. Has NPO diet currently given pre-op.   Will need regular diet placed after  OR.  Patient under IVC at this time.  CIWA order placed. Per chart review patient with history of WD symptoms however no WD seizures or DTs.      MDM Rules/Calculators/A&P                           Final Clinical Impression(s) / ED Diagnoses Final diagnoses:  Suicide attempt ([place])  Laceration of multiple sites of left hand and wrist, initial encounter  Laceration of multiple sites of hand  and wrist, right, initial encounter    Rx / DC Orders ED Discharge Orders    None       Amesha Bailey A, PA-C 07/06/20 1445    Isla Pence, MD 07/07/20 1728

## 2020-07-06 NOTE — Anesthesia Postprocedure Evaluation (Signed)
Anesthesia Post Note  Patient: Peter Fuentes  Procedure(s) Performed: IRRIGATION AND DEBRIDEMENT OF RIGHT WRIST WITH COMPLEX CLOSURE , IRRIGATION AND DEBRIDEMENT OF LEFT WRIST WITH MEDIAN NERVE REPAIR, TENDON REPAIR X2, COMPLEX CLOSURE (Bilateral )     Patient location during evaluation: PACU Anesthesia Type: General Level of consciousness: awake and alert Pain management: pain level controlled Vital Signs Assessment: post-procedure vital signs reviewed and stable Respiratory status: spontaneous breathing, nonlabored ventilation, respiratory function stable and patient connected to nasal cannula oxygen Cardiovascular status: blood pressure returned to baseline and stable Postop Assessment: no apparent nausea or vomiting Anesthetic complications: no   No complications documented.  Last Vitals:  Vitals:   07/06/20 2105 07/06/20 2120  BP: (!) 168/85 (!) 132/114  Pulse: 89 89  Resp: 12 12  Temp:    SpO2: 99% 99%    Last Pain:  Vitals:   07/06/20 2120  TempSrc:   PainSc: 0-No pain                 Jordy Verba COKER

## 2020-07-06 NOTE — H&P (View-Only) (Signed)
Reason for Consult:Wrist lacs Referring Physician: Para Skeans Time called: 1130 Time at bedside: 1139   Peter Fuentes is an 60 y.o. male.  HPI: [name] attempted suicide earlier this morning by cutting his wrists. He was brought to the ED and hand surgery was consulted 2/2 exposed transected tendons. He is LHD.  Past Medical History:  Diagnosis Date  . COVID-19 01/2018  . Depression     No past surgical history on file.  No family history on file.  Social History:  reports that he has been smoking. He has never used smokeless tobacco. He reports current alcohol use. He reports that he does not use drugs.  Allergies: No Known Allergies  Medications: I have reviewed the patient's current medications.  Results for orders placed or performed during the hospital encounter of 07/06/20 (from the past 48 hour(s))  Comprehensive metabolic panel     Status: Abnormal   Collection Time: 07/06/20  9:18 AM  Result Value Ref Range   Sodium 135 135 - 145 mmol/L   Potassium 4.3 3.5 - 5.1 mmol/L   Chloride 101 98 - 111 mmol/L   CO2 21 (L) 22 - 32 mmol/L   Glucose, Bld 81 70 - 99 mg/dL    Comment: Glucose reference range applies only to samples taken after fasting for at least 8 hours.   BUN 6 6 - 20 mg/dL   Creatinine, Ser 0.16 0.61 - 1.24 mg/dL   Calcium 9.6 8.9 - 01.0 mg/dL   Total Protein 7.3 6.5 - 8.1 g/dL   Albumin 4.1 3.5 - 5.0 g/dL   AST 97 (H) 15 - 41 U/L   ALT 104 (H) 0 - 44 U/L   Alkaline Phosphatase 74 38 - 126 U/L   Total Bilirubin 1.3 (H) 0.3 - 1.2 mg/dL   GFR, Estimated >93 >23 mL/min    Comment: (NOTE) Calculated using the CKD-EPI Creatinine Equation (2021)    Anion gap 13 5 - 15    Comment: Performed at [facility], [address] [place], [place] [address]  Ethanol     Status: Abnormal   Collection Time: 07/06/20  9:18 AM  Result Value Ref Range   Alcohol, Ethyl (B) 210 (H) <10 mg/dL    Comment: (NOTE) Lowest detectable limit for serum alcohol is 10  mg/dL.  For medical purposes only. Performed at [facility], [address] [place], [place] [address]   Salicylate level     Status: Abnormal   Collection Time: 07/06/20  9:18 AM  Result Value Ref Range   Salicylate Lvl <7.0 (L) 7.0 - 30.0 mg/dL    Comment: Performed at [facility], [address] [place], [place] [address]  Acetaminophen level     Status: Abnormal   Collection Time: 07/06/20  9:18 AM  Result Value Ref Range   Acetaminophen (Tylenol), Serum <10 (L) 10 - 30 ug/mL    Comment: (NOTE) Therapeutic concentrations vary significantly. A range of 10-30 ug/mL  may be an effective concentration for many patients. However, some  are best treated at concentrations outside of this range. Acetaminophen concentrations >150 ug/mL at 4 hours after ingestion  and >50 ug/mL at 12 hours after ingestion are often associated with  toxic reactions.  Performed at [facility], [address] [place], [place] [address]   cbc     Status: Abnormal   Collection Time: 07/06/20  9:18 AM  Result Value Ref Range   WBC 4.2 4.0 -  10.5 K/uL   RBC 4.56 4.22 - 5.81 MIL/uL   Hemoglobin 16.2 13.0 - 17.0 g/dL   HCT 69.6 78.9 - 38.1 %   MCV 102.0 (H) 80.0 - 100.0 fL   MCH 35.5 (H) 26.0 - 34.0 pg   MCHC 34.8 30.0 - 36.0 g/dL   RDW 01.7 51.0 - 25.8 %   Platelets 132 (L) 150 - 400 K/uL   nRBC 0.0 0.0 - 0.2 %    Comment: Performed at [facility], [address], [place], [place] [address]  CK     Status: None   Collection Time: 07/06/20  9:18 AM  Result Value Ref Range   Total CK 90 49 - 397 U/L    Comment: Performed at [facility], [address] [place], [place] [address]  Resp Panel by RT-PCR (Flu A&B, Covid) Nasopharyngeal Swab     Status: None   Collection Time: 07/06/20  9:37 AM   Specimen: Nasopharyngeal Swab; Nasopharyngeal(NP) swabs in vial transport medium  Result Value Ref Range   SARS Coronavirus 2 by RT PCR NEGATIVE NEGATIVE    Comment:  (NOTE) SARS-CoV-2 target nucleic acids are NOT DETECTED.  The SARS-CoV-2 RNA is generally detectable in upper respiratory specimens during the acute phase of infection. The lowest concentration of SARS-CoV-2 viral copies this assay can detect is 138 copies/mL. A negative result does not preclude SARS-Cov-2 infection and should not be used as the sole basis for treatment or other patient management decisions. A negative result may occur with  improper specimen collection/handling, submission of specimen other than nasopharyngeal swab, presence of viral mutation(s) within the areas targeted by this assay, and inadequate number of viral copies(<138 copies/mL). A negative result must be combined with clinical observations, patient history, and epidemiological information. The expected result is Negative.  Fact Sheet for Patients:  BloggerCourse.com  Fact Sheet for Healthcare Providers:  SeriousBroker.it  This test is no t yet approved or cleared by the [place] FDA and  has been authorized for detection and/or diagnosis of SARS-CoV-2 by FDA under an Emergency Use Authorization (EUA). This EUA will remain  in effect (meaning this test can be used) for the duration of the COVID-19 declaration under Section 564(b)(1) of the Act, 21 U.S.C.section 360bbb-3(b)(1), unless the authorization is terminated  or revoked sooner.       Influenza A by PCR NEGATIVE NEGATIVE   Influenza B by PCR NEGATIVE NEGATIVE    Comment: (NOTE) The Xpert Xpress SARS-CoV-2/FLU/RSV plus assay is intended as an aid in the diagnosis of influenza from Nasopharyngeal swab specimens and should not be used as a sole basis for treatment. Nasal washings and aspirates are unacceptable for Xpert Xpress SARS-CoV-2/FLU/RSV testing.  Fact Sheet for Patients: BloggerCourse.com  Fact Sheet for Healthcare  Providers: SeriousBroker.it  This test is not yet approved or cleared by the [place] FDA and has been authorized for detection and/or diagnosis of SARS-CoV-2 by FDA under an Emergency Use Authorization (EUA). This EUA will remain in effect (meaning this test can be used) for the duration of the COVID-19 declaration under Section 564(b)(1) of the Act, 21 U.S.C. section 360bbb-3(b)(1), unless the authorization is terminated or revoked.  Performed at [facility], [address] [place], [place] [address]     DG Chest 1 View  Result Date: 07/06/2020 CLINICAL DATA:  Suicide attempt. EXAM: CHEST  1 VIEW COMPARISON:  04/10/2019 FINDINGS: Heart size and vascularity normal. Calcified granulomata in the right mid  and lower lobe and in the right hilum unchanged. Lungs are otherwise clear without infiltrate or effusion. No pneumothorax. Chronic right rib fractures. IMPRESSION: No active disease. Electronically Signed   By: Charles  Clark M.D.   On: 07/06/2020 10:28   DG Pelvis 1-2 Views  Result Date: 07/06/2020 CLINICAL DATA:  Suicide attempt.  Lacerations. EXAM: PELVIS - 1-2 VIEW COMPARISON:  None. FINDINGS: There is no evidence of pelvic fracture or diastasis. No pelvic bone lesions are seen. IMPRESSION: Negative. Electronically Signed   By: Charles  Clark M.D.   On: 07/06/2020 10:26   DG Forearm Left  Result Date: 07/06/2020 CLINICAL DATA:  Suicide attempt, laceration to distal forearms EXAM: LEFT FOREARM - 2 VIEW COMPARISON:  Concurrently obtained radiographs of the right forearm FINDINGS: No acute fracture, malalignment or evidence of retained radiopaque foreign body. Soft tissue irregularity overlying the distal wrist consistent with the clinical history of laceration. No osseous abnormality. IMPRESSION: Soft tissue laceration without evidence of bony involvement or retained radiopaque foreign body. Electronically Signed   By: Heath  McCullough M.D.   On:  07/06/2020 10:28   DG Forearm Right  Result Date: 07/06/2020 CLINICAL DATA:  Suicide attempt.  Lacerations EXAM: RIGHT FOREARM - 2 VIEW COMPARISON:  None. FINDINGS: There is no evidence of fracture or other focal bone lesions. Soft tissues are unremarkable. No foreign body. IMPRESSION: Negative. Electronically Signed   By: Charles  Clark M.D.   On: 07/06/2020 10:27   CT Head Wo Contrast  Result Date: 07/06/2020 CLINICAL DATA:  Facial trauma.  Delirium. EXAM: CT HEAD WITHOUT CONTRAST CT CERVICAL SPINE WITHOUT CONTRAST TECHNIQUE: Multidetector CT imaging of the head and cervical spine was performed following the standard protocol without intravenous contrast. Multiplanar CT image reconstructions of the cervical spine were also generated. COMPARISON:  CT head 04/10/2019 FINDINGS: CT HEAD FINDINGS Brain: No evidence of acute infarction, hemorrhage, hydrocephalus, extra-axial collection or mass lesion/mass effect. Diffuse cortical atrophy. Periventricular white matter hypodensity is a nonspecific finding, but most commonly relates to chronic ischemic small vessel disease. Vascular: No hyperdense vessel or unexpected calcification. Skull: Normal. Negative for fracture or focal lesion. Sinuses/Orbits: Partial opacification of the maxillary sinuses. Other: None. CT CERVICAL SPINE FINDINGS Alignment: Within normal limits. Skull base and vertebrae: No acute fracture. No primary bone lesion or focal pathologic process. Soft tissues and spinal canal: No prevertebral fluid or swelling. No visible canal hematoma. Disc levels: Mild disc height loss at C5-C6. Advanced facet degenerative changes seen throughout the cervical spine. Degenerative changes are seen throughout the cervical spine with greatest disease burden at C6-C7 with posterior spondylotic ridge and bulky facet arthropathy causing severe bilateral foraminal stenosis. Upper chest: Mild emphysematous changes seen in the upper lungs. Other: None IMPRESSION: 1. No  acute intracranial abnormality. 2. No acute fracture or dislocation of the cervical and visualized upper thoracic spine. 3. Advanced multilevel degenerative changes of the cervical spine. Electronically Signed   By: Farhaan  Mir M.D.   On: 07/06/2020 11:26    Review of Systems  HENT: Negative for ear discharge, ear pain, hearing loss and tinnitus.   Eyes: Negative for photophobia and pain.  Respiratory: Negative for cough and shortness of breath.   Cardiovascular: Negative for chest pain.  Gastrointestinal: Negative for abdominal pain, nausea and vomiting.  Genitourinary: Negative for dysuria, flank pain, frequency and urgency.  Musculoskeletal: Positive for myalgias (Bilateral wrists). Negative for back pain and neck pain.  Neurological: Negative for dizziness and headaches.  Hematological: Does not bruise/bleed easily.    Psychiatric/Behavioral: The patient is not nervous/anxious.    Blood pressure 107/80, pulse 75, temperature 98 F (36.7 C), temperature source Oral, resp. rate 16, SpO2 98 %. Physical Exam Constitutional:      General: He is not in acute distress.    Appearance: He is well-developed. He is not diaphoretic.  HENT:     Head: Normocephalic and atraumatic.  Eyes:     General: No scleral icterus.       Right eye: No discharge.        Left eye: No discharge.     Conjunctiva/sclera: Conjunctivae normal.  Cardiovascular:     Rate and Rhythm: Normal rate and regular rhythm.  Pulmonary:     Effort: Pulmonary effort is normal. No respiratory distress.  Musculoskeletal:     Cervical back: Normal range of motion.     Comments: Right shoulder, elbow, wrist, digits- 3 parallel transverse volar wrist lacs, mostly superficial, mod TTP, no instability, no blocks to motion  Sens  Ax/R/M/U intact  Mot   Ax/ R/ PIN/ M/ AIN/ U intact  Rad 2+  Left shoulder, elbow, wrist, digits- 3 parallel transverse volar wrist lacs with 3 exposed transected tendons in distal lac, mod TTP, no  instability, no blocks to motion  Sens  Ax/R/M/U intact  Mot   Ax/ R/ PIN/ M/ AIN/ U intact  Rad 2+   Skin:    General: Skin is warm and dry.  Neurological:     Mental Status: He is alert.  Psychiatric:        Behavior: Behavior normal.     Assessment/Plan: Bilateral wrist lacerations -- Despite good fxn and sensation probably best to explore and repair given dominant hand. Plan for this afternoon with Dr. Arita Miss. Please keep NPO.    Freeman Caldron, PA-C Orthopedic Surgery [phone] 07/06/2020, 12:25 PM

## 2020-07-06 NOTE — BH Assessment (Signed)
Comprehensive Clinical Assessment (CCA) Note  07/06/2020 Rebecca Eaton [id number]  DISPOSITION:  Gave clinical report to [name], NP, who determined that Pt meets inpatient criteria.  Advised AC RN [name] -- Pt is currently under review at [facility], and social work will look at other locations.  The patient demonstrates the following risk factors for suicide: Chronic risk factors for suicide include: demographic factors (male, >60 y/o). Acute risk factors for suicide include: family or marital conflict and social withdrawal/isolation. Protective factors for this patient include: positive social support. Considering these factors, the overall suicide risk at this point appears to be high. Patient is not appropriate for outpatient follow up.  Flowsheet Row ED from 07/06/2020 in [facility] EMERGENCY DEPARTMENT  C-SSRS RISK CATEGORY High Risk     Pt made a suicide attempt, and he is at high risk of suicide.  Therefore a 1:1 sitter is recommended.  Chief Complaint:  Chief Complaint  Patient presents with  . Suicide Attempt    Pt attempted by suicide -- bilateral cuts to wrists.   Visit Diagnosis: Major Depressive Disorder  Narrative:Pt is a 60 year old male who presented to [facility] on a voluntary basis (transported via EMS) after being found by mother with both wrists cut in self-described suicide attempt.  Pt lives in [place] with mother and her husband.  Pt is divorced and has no children.  Pt is currently employed.  Pt does not have an outpatient psychiatric or therapy provider.  Per hospital report, Pt's mother found Pt lying face down on his bed with both wrists cuts and a blade on the bed.  She called EMS.  EMS transported Pt to [facility].  Pt admitted that he attempted suicide by cutting.  He reported that he has been depressed for about a month due to conflict with family.  Pt stated that he has felt depressed, and as a result, he attempted to kill himself.  Pt denied past  attempts.  He also denied hallucination, homicidal ideation, self-injurious behavior, and substance use concerns.  Pt said he is not concerned about his alcohol use and that he drinks socially.  It may be that Pt minimizes alcohol use -- BAC on admission was 210.  Pt's UDS was positive for opioids.  During assessment, it was determined that Pt needed surgery for wrist laceration.  During assessment, Pt presented as alert and oriented.  He had good eye contact.  Pt's demeanor was guarded, and it may be that he minimized symptoms.  Pt was gowned, and he appeared appropriately groomed.  Pt's mood was depressed, and affect was blunted.  Pt's speech was normal in rate, rhythm, and volume.  Thought processes were within normal range, and thought content was logical and goal-oriented.  There was no evidence of delusion.  Memory and concentration were intact.  Insight, judgment, and impulse control were deemed poor as evidenced by suicide attempt.     CCA Screening, Triage and Referral (STR)  Patient Reported Information How did you hear about [place]? Other (Comment) (EMS)  Referral name: Doree Albee, Pt's mother  Referral phone number: [phone]   Whom do you see for routine medical problems? I don't have a doctor  Practice/Facility Name: No data recorded Practice/Facility Phone Number: No data recorded Name of Contact: No data recorded Contact Number: No data recorded Contact Fax Number: No data recorded Prescriber Name: No data recorded Prescriber Address (if known): No data recorded  What Is the Reason for Your Visit/Call Today?  Suicide attempt  How Long Has This Been Causing You Problems? 1-6 months  What Do You Feel Would Help You the Most Today? Treatment for Depression or other mood problem; Stress Management; Medication(s)   Have You Recently Been in Any Inpatient Treatment (Hospital/Detox/Crisis Center/28-Day Program)? No  Name/Location of Program/Hospital:No data recorded How  Long Were You There? No data recorded When Were You Discharged? No data recorded  Have You Ever Received Services From [facility] Before? No data recorded Who Do You See at [facility]? No data recorded  Have You Recently Had Any Thoughts About Hurting Yourself? Yes  Are You Planning to Commit Suicide/Harm Yourself At This time? Yes   Have you Recently Had Thoughts About Hurting Someone Karolee Ohs? No  Explanation: No data recorded  Have You Used Any Alcohol or Drugs in the Past 24 Hours? Yes  How Long Ago Did You Use Drugs or Alcohol? No data recorded What Did You Use and How Much? BAC .210 on admission   Do You Currently Have a Therapist/Psychiatrist? No  Name of Therapist/Psychiatrist: No data recorded  Have You Been Recently Discharged From Any Office Practice or Programs? No  Explanation of Discharge From Practice/Program: No data recorded    CCA Screening Triage Referral Assessment Type of Contact: Tele-Assessment  Is this Initial or Reassessment? Initial Assessment  Date Telepsych consult ordered in [facility]:  07/06/2020  Time Telepsych consult ordered in [facility]:  No data recorded  Patient Reported Information Reviewed? Yes  Patient Left Without Being Seen? No data recorded Reason for Not Completing Assessment: No data recorded  Collateral Involvement: No data recorded  Does Patient Have a Court Appointed Legal Guardian? No data recorded Name and Contact of Legal Guardian: No data recorded If Minor and Not Living with Parent(s), Who has Custody? No data recorded Is CPS involved or ever been involved? Never  Is APS involved or ever been involved? Never   Patient Determined To Be At Risk for Harm To Self or Others Based on Review of Patient Reported Information or Presenting Complaint? Yes, for Self-Harm  Method: No data recorded Availability of Means: No data recorded Intent: No data recorded Notification Required: No data recorded Additional Information for Danger  to Others Potential: No data recorded Additional Comments for Danger to Others Potential: No data recorded Are There Guns or Other Weapons in Your Home? No data recorded Types of Guns/Weapons: No data recorded Are These Weapons Safely Secured?                            No data recorded Who Could Verify You Are Able To Have These Secured: No data recorded Do You Have any Outstanding Charges, Pending Court Dates, Parole/Probation? No data recorded Contacted To Inform of Risk of Harm To Self or Others: No data recorded  Location of Assessment: [facility] ED   Does Patient Present under Involuntary Commitment? No  IVC Papers Initial File Date: No data recorded  [place] of Residence: [place]   Patient Currently Receiving the Following Services: Not Receiving Services   Determination of Need: Emergent (2 hours)   Options For Referral: Medication Management; Inpatient Hospitalization     CCA Biopsychosocial Intake/Chief Complaint:  Pt attempted suicide by bilateral cuts to wrists; depression  Current Symptoms/Problems: Suicide attempt; depressive symptoms   Patient Reported Schizophrenia/Schizoaffective Diagnosis in Past: No   Strengths: No data recorded Preferences: No data recorded Abilities: No data recorded  Type of Services Patient  Feels are Needed: No data recorded  Initial Clinical Notes/Concerns: Pt endorsed suicidal attempt; denied hallucination, homicidal ideation; self-injurious behavior.  Pt's BAC was 210 on admission   Mental Health Symptoms Depression:  Change in energy/activity; Worthlessness; Hopelessness   Duration of Depressive symptoms: Greater than two weeks   Mania:  None   Anxiety:   None   Psychosis:  None   Duration of Psychotic symptoms: No data recorded  Trauma:  None   Obsessions:  None   Compulsions:  None   Inattention:  None   Hyperactivity/Impulsivity:  N/A   Oppositional/Defiant Behaviors:  None   Emotional Irregularity:   None   Other Mood/Personality Symptoms:  Pt appeared guarded    Mental Status Exam Appearance and self-care  Stature:  Average   Weight:  Average weight   Clothing:  Casual   Grooming:  Normal   Cosmetic use:  None   Posture/gait:  Normal   Motor activity:  Not Remarkable   Sensorium  Attention:  Normal   Concentration:  Normal   Orientation:  X5   Recall/memory:  No data recorded  Affect and Mood  Affect:  Blunted   Mood:  Depressed   Relating  Eye contact:  Normal   Facial expression:  Constricted   Attitude toward examiner:  Guarded   Thought and Language  Speech flow: Clear and Coherent   Thought content:  Appropriate to Mood and Circumstances   Preoccupation:  None   Hallucinations:  None   Organization:  No data recorded  [employer] of Knowledge:  Average   Intelligence:  Average   Abstraction:  Normal   Judgement:  Poor   Reality Testing:  Realistic   Insight:  Poor   Decision Making:  Impulsive   Social Functioning  Social Maturity:  Isolates   Social Judgement:  Victimized   Stress  Stressors:  Family conflict   Coping Ability:  Deficient supports   Skill Deficits:  None   Supports:  Family     Religion:    Leisure/Recreation:    Exercise/Diet: Exercise/Diet Do You Have Any Trouble Sleeping?: No   CCA Employment/Education Employment/Work Situation: Employment / Work Situation Employment situation: Employed  Education: Education Is Patient Currently Attending School?: No   CCA Family/Childhood History Family and Relationship History: Family history Marital status: Divorced Does patient have children?: No  Childhood History:  Childhood History By whom was/is the patient raised?: Mother Has patient been affected by domestic violence as an adult?: No  Child/Adolescent Assessment:     CCA Substance Use Alcohol/Drug Use: Alcohol / Drug Use Pain Medications: Please see  MAR Prescriptions: Please see MAR Over the Counter: Please see MAR History of alcohol / drug use?: Yes Substance #1 Name of Substance 1: Alcohol 1 - Last Use / Amount: 07/05/2020 -- BAC on admission was .210 Substance #2 Name of Substance 2: Opioids                     ASAM's:  Six Dimensions of Multidimensional Assessment  Dimension 1:  Acute Intoxication and/or Withdrawal Potential:      Dimension 2:  Biomedical Conditions and Complications:      Dimension 3:  Emotional, Behavioral, or Cognitive Conditions and Complications:     Dimension 4:  Readiness to Change:     Dimension 5:  Relapse, Continued use, or Continued Problem Potential:     Dimension 6:  Recovery/Living Environment:     ASAM Severity Score:  ASAM Recommended Level of Treatment:     Substance use Disorder (SUD)    Recommendations for Services/Supports/Treatments:    DSM5 Diagnoses: There are no problems to display for this patient.   Patient Centered Plan: Patient is on the following Treatment Plan(s):     Referrals to Alternative Service(s): Referred to Alternative Service(s):   Place:   Date:   Time:    Referred to Alternative Service(s):   Place:   Date:   Time:    Referred to Alternative Service(s):   Place:   Date:   Time:    Referred to Alternative Service(s):   Place:   Date:   Time:     Earline Mayotteugene T Andreal Vultaggio, [facility]

## 2020-07-06 NOTE — BH Assessment (Addendum)
Disposition: [name], NP,determined that Pt meets inpatient criteria. Upon chart review TTS staff [name]) noted that he advised [facility] RN [name] -- "Pt is currently under review at [facility], and social work will look at other locations". @ 2348 followed up with night time [facility] [name], RN) for a follow up regarding patient's review at [facility] for a bed. Also, faxed patient to the following facilities for review and consideration of bed placement:   [facility] Details  [facility] Details  [facility] Details  [facility] Details  [facility] Details  [facility] Details  [facility] Details  [facility] Details  [facility] Details  [facility] Details  [facility] Details  [facility] Details  [facility] Details  [facility]  [facility] Details  [facility] Details  [facility] Details  [facility] Details  [facility] Details  [facility] Details  [facility] Details  [facility] Details  [facility]  [facility]

## 2020-07-06 NOTE — Anesthesia Procedure Notes (Signed)
Procedure Name: LMA Insertion Date/Time: 07/06/2020 7:09 PM Performed by: Molli Hazard, CRNA Pre-anesthesia Checklist: Patient identified, Emergency Drugs available, Suction available and Patient being monitored Patient Re-evaluated:Patient Re-evaluated prior to induction Oxygen Delivery Method: Circle system utilized Preoxygenation: Pre-oxygenation with 100% oxygen Induction Type: IV induction Ventilation: Mask ventilation without difficulty LMA: LMA inserted LMA Size: 4.0 Number of attempts: 1 Placement Confirmation: positive ETCO2 and breath sounds checked- equal and bilateral Tube secured with: Tape Dental Injury: Teeth and Oropharynx as per pre-operative assessment

## 2020-07-06 NOTE — Progress Notes (Signed)
Report given to [place] charge RN states [name] RN will be assuming care in bed 38.

## 2020-07-06 NOTE — Consult Note (Signed)
Reason for Consult:Wrist lacs Referring Physician: Para Skeans Time called: 1130 Time at bedside: 1139   Peter Fuentes is an 60 y.o. male.  HPI: [name] attempted suicide earlier this morning by cutting his wrists. He was brought to the ED and hand surgery was consulted 2/2 exposed transected tendons. He is LHD.  Past Medical History:  Diagnosis Date  . COVID-19 01/2018  . Depression     No past surgical history on file.  No family history on file.  Social History:  reports that he has been smoking. He has never used smokeless tobacco. He reports current alcohol use. He reports that he does not use drugs.  Allergies: No Known Allergies  Medications: I have reviewed the patient's current medications.  Results for orders placed or performed during the hospital encounter of 07/06/20 (from the past 48 hour(s))  Comprehensive metabolic panel     Status: Abnormal   Collection Time: 07/06/20  9:18 AM  Result Value Ref Range   Sodium 135 135 - 145 mmol/L   Potassium 4.3 3.5 - 5.1 mmol/L   Chloride 101 98 - 111 mmol/L   CO2 21 (L) 22 - 32 mmol/L   Glucose, Bld 81 70 - 99 mg/dL    Comment: Glucose reference range applies only to samples taken after fasting for at least 8 hours.   BUN 6 6 - 20 mg/dL   Creatinine, Ser 0.16 0.61 - 1.24 mg/dL   Calcium 9.6 8.9 - 01.0 mg/dL   Total Protein 7.3 6.5 - 8.1 g/dL   Albumin 4.1 3.5 - 5.0 g/dL   AST 97 (H) 15 - 41 U/L   ALT 104 (H) 0 - 44 U/L   Alkaline Phosphatase 74 38 - 126 U/L   Total Bilirubin 1.3 (H) 0.3 - 1.2 mg/dL   GFR, Estimated >93 >23 mL/min    Comment: (NOTE) Calculated using the CKD-EPI Creatinine Equation (2021)    Anion gap 13 5 - 15    Comment: Performed at [facility], [address] [place], [place] [address]  Ethanol     Status: Abnormal   Collection Time: 07/06/20  9:18 AM  Result Value Ref Range   Alcohol, Ethyl (B) 210 (H) <10 mg/dL    Comment: (NOTE) Lowest detectable limit for serum alcohol is 10  mg/dL.  For medical purposes only. Performed at [facility], [address] [place], [place] [address]   Salicylate level     Status: Abnormal   Collection Time: 07/06/20  9:18 AM  Result Value Ref Range   Salicylate Lvl <7.0 (L) 7.0 - 30.0 mg/dL    Comment: Performed at [facility], [address] [place], [place] [address]  Acetaminophen level     Status: Abnormal   Collection Time: 07/06/20  9:18 AM  Result Value Ref Range   Acetaminophen (Tylenol), Serum <10 (L) 10 - 30 ug/mL    Comment: (NOTE) Therapeutic concentrations vary significantly. A range of 10-30 ug/mL  may be an effective concentration for many patients. However, some  are best treated at concentrations outside of this range. Acetaminophen concentrations >150 ug/mL at 4 hours after ingestion  and >50 ug/mL at 12 hours after ingestion are often associated with  toxic reactions.  Performed at [facility], [address] [place], [place] [address]   cbc     Status: Abnormal   Collection Time: 07/06/20  9:18 AM  Result Value Ref Range   WBC 4.2 4.0 -  10.5 K/uL   RBC 4.56 4.22 - 5.81 MIL/uL   Hemoglobin 16.2 13.0 - 17.0 g/dL   HCT 69.6 78.9 - 38.1 %   MCV 102.0 (H) 80.0 - 100.0 fL   MCH 35.5 (H) 26.0 - 34.0 pg   MCHC 34.8 30.0 - 36.0 g/dL   RDW 01.7 51.0 - 25.8 %   Platelets 132 (L) 150 - 400 K/uL   nRBC 0.0 0.0 - 0.2 %    Comment: Performed at [facility], [address], [place], [place] [address]  CK     Status: None   Collection Time: 07/06/20  9:18 AM  Result Value Ref Range   Total CK 90 49 - 397 U/L    Comment: Performed at [facility], [address] [place], [place] [address]  Resp Panel by RT-PCR (Flu A&B, Covid) Nasopharyngeal Swab     Status: None   Collection Time: 07/06/20  9:37 AM   Specimen: Nasopharyngeal Swab; Nasopharyngeal(NP) swabs in vial transport medium  Result Value Ref Range   SARS Coronavirus 2 by RT PCR NEGATIVE NEGATIVE    Comment:  (NOTE) SARS-CoV-2 target nucleic acids are NOT DETECTED.  The SARS-CoV-2 RNA is generally detectable in upper respiratory specimens during the acute phase of infection. The lowest concentration of SARS-CoV-2 viral copies this assay can detect is 138 copies/mL. A negative result does not preclude SARS-Cov-2 infection and should not be used as the sole basis for treatment or other patient management decisions. A negative result may occur with  improper specimen collection/handling, submission of specimen other than nasopharyngeal swab, presence of viral mutation(s) within the areas targeted by this assay, and inadequate number of viral copies(<138 copies/mL). A negative result must be combined with clinical observations, patient history, and epidemiological information. The expected result is Negative.  Fact Sheet for Patients:  BloggerCourse.com  Fact Sheet for Healthcare Providers:  SeriousBroker.it  This test is no t yet approved or cleared by the [place] FDA and  has been authorized for detection and/or diagnosis of SARS-CoV-2 by FDA under an Emergency Use Authorization (EUA). This EUA will remain  in effect (meaning this test can be used) for the duration of the COVID-19 declaration under Section 564(b)(1) of the Act, 21 U.S.C.section 360bbb-3(b)(1), unless the authorization is terminated  or revoked sooner.       Influenza A by PCR NEGATIVE NEGATIVE   Influenza B by PCR NEGATIVE NEGATIVE    Comment: (NOTE) The Xpert Xpress SARS-CoV-2/FLU/RSV plus assay is intended as an aid in the diagnosis of influenza from Nasopharyngeal swab specimens and should not be used as a sole basis for treatment. Nasal washings and aspirates are unacceptable for Xpert Xpress SARS-CoV-2/FLU/RSV testing.  Fact Sheet for Patients: BloggerCourse.com  Fact Sheet for Healthcare  Providers: SeriousBroker.it  This test is not yet approved or cleared by the [place] FDA and has been authorized for detection and/or diagnosis of SARS-CoV-2 by FDA under an Emergency Use Authorization (EUA). This EUA will remain in effect (meaning this test can be used) for the duration of the COVID-19 declaration under Section 564(b)(1) of the Act, 21 U.S.C. section 360bbb-3(b)(1), unless the authorization is terminated or revoked.  Performed at [facility], [address] [place], [place] [address]     DG Chest 1 View  Result Date: 07/06/2020 CLINICAL DATA:  Suicide attempt. EXAM: CHEST  1 VIEW COMPARISON:  04/10/2019 FINDINGS: Heart size and vascularity normal. Calcified granulomata in the right mid  and lower lobe and in the right hilum unchanged. Lungs are otherwise clear without infiltrate or effusion. No pneumothorax. Chronic right rib fractures. IMPRESSION: No active disease. Electronically Signed   By: Marlan Palauharles  Clark M.D.   On: 07/06/2020 10:28   DG Pelvis 1-2 Views  Result Date: 07/06/2020 CLINICAL DATA:  Suicide attempt.  Lacerations. EXAM: PELVIS - 1-2 VIEW COMPARISON:  None. FINDINGS: There is no evidence of pelvic fracture or diastasis. No pelvic bone lesions are seen. IMPRESSION: Negative. Electronically Signed   By: Marlan Palauharles  Clark M.D.   On: 07/06/2020 10:26   DG Forearm Left  Result Date: 07/06/2020 CLINICAL DATA:  Suicide attempt, laceration to distal forearms EXAM: LEFT FOREARM - 2 VIEW COMPARISON:  Concurrently obtained radiographs of the right forearm FINDINGS: No acute fracture, malalignment or evidence of retained radiopaque foreign body. Soft tissue irregularity overlying the distal wrist consistent with the clinical history of laceration. No osseous abnormality. IMPRESSION: Soft tissue laceration without evidence of bony involvement or retained radiopaque foreign body. Electronically Signed   By: Malachy MoanHeath  McCullough M.D.   On:  07/06/2020 10:28   DG Forearm Right  Result Date: 07/06/2020 CLINICAL DATA:  Suicide attempt.  Lacerations EXAM: RIGHT FOREARM - 2 VIEW COMPARISON:  None. FINDINGS: There is no evidence of fracture or other focal bone lesions. Soft tissues are unremarkable. No foreign body. IMPRESSION: Negative. Electronically Signed   By: Marlan Palauharles  Clark M.D.   On: 07/06/2020 10:27   CT Head Wo Contrast  Result Date: 07/06/2020 CLINICAL DATA:  Facial trauma.  Delirium. EXAM: CT HEAD WITHOUT CONTRAST CT CERVICAL SPINE WITHOUT CONTRAST TECHNIQUE: Multidetector CT imaging of the head and cervical spine was performed following the standard protocol without intravenous contrast. Multiplanar CT image reconstructions of the cervical spine were also generated. COMPARISON:  CT head 04/10/2019 FINDINGS: CT HEAD FINDINGS Brain: No evidence of acute infarction, hemorrhage, hydrocephalus, extra-axial collection or mass lesion/mass effect. Diffuse cortical atrophy. Periventricular white matter hypodensity is a nonspecific finding, but most commonly relates to chronic ischemic small vessel disease. Vascular: No hyperdense vessel or unexpected calcification. Skull: Normal. Negative for fracture or focal lesion. Sinuses/Orbits: Partial opacification of the maxillary sinuses. Other: None. CT CERVICAL SPINE FINDINGS Alignment: Within normal limits. Skull base and vertebrae: No acute fracture. No primary bone lesion or focal pathologic process. Soft tissues and spinal canal: No prevertebral fluid or swelling. No visible canal hematoma. Disc levels: Mild disc height loss at C5-C6. Advanced facet degenerative changes seen throughout the cervical spine. Degenerative changes are seen throughout the cervical spine with greatest disease burden at C6-C7 with posterior spondylotic ridge and bulky facet arthropathy causing severe bilateral foraminal stenosis. Upper chest: Mild emphysematous changes seen in the upper lungs. Other: None IMPRESSION: 1. No  acute intracranial abnormality. 2. No acute fracture or dislocation of the cervical and visualized upper thoracic spine. 3. Advanced multilevel degenerative changes of the cervical spine. Electronically Signed   By: Acquanetta BellingFarhaan  Mir M.D.   On: 07/06/2020 11:26    Review of Systems  HENT: Negative for ear discharge, ear pain, hearing loss and tinnitus.   Eyes: Negative for photophobia and pain.  Respiratory: Negative for cough and shortness of breath.   Cardiovascular: Negative for chest pain.  Gastrointestinal: Negative for abdominal pain, nausea and vomiting.  Genitourinary: Negative for dysuria, flank pain, frequency and urgency.  Musculoskeletal: Positive for myalgias (Bilateral wrists). Negative for back pain and neck pain.  Neurological: Negative for dizziness and headaches.  Hematological: Does not bruise/bleed easily.  Psychiatric/Behavioral: The patient is not nervous/anxious.    Blood pressure 107/80, pulse 75, temperature 98 F (36.7 C), temperature source Oral, resp. rate 16, SpO2 98 %. Physical Exam Constitutional:      General: He is not in acute distress.    Appearance: He is well-developed. He is not diaphoretic.  HENT:     Head: Normocephalic and atraumatic.  Eyes:     General: No scleral icterus.       Right eye: No discharge.        Left eye: No discharge.     Conjunctiva/sclera: Conjunctivae normal.  Cardiovascular:     Rate and Rhythm: Normal rate and regular rhythm.  Pulmonary:     Effort: Pulmonary effort is normal. No respiratory distress.  Musculoskeletal:     Cervical back: Normal range of motion.     Comments: Right shoulder, elbow, wrist, digits- 3 parallel transverse volar wrist lacs, mostly superficial, mod TTP, no instability, no blocks to motion  Sens  Ax/R/M/U intact  Mot   Ax/ R/ PIN/ M/ AIN/ U intact  Rad 2+  Left shoulder, elbow, wrist, digits- 3 parallel transverse volar wrist lacs with 3 exposed transected tendons in distal lac, mod TTP, no  instability, no blocks to motion  Sens  Ax/R/M/U intact  Mot   Ax/ R/ PIN/ M/ AIN/ U intact  Rad 2+   Skin:    General: Skin is warm and dry.  Neurological:     Mental Status: He is alert.  Psychiatric:        Behavior: Behavior normal.     Assessment/Plan: Bilateral wrist lacerations -- Despite good fxn and sensation probably best to explore and repair given dominant hand. Plan for this afternoon with Dr. Arita Miss. Please keep NPO.    Freeman Caldron, PA-C Orthopedic Surgery [phone] 07/06/2020, 12:25 PM

## 2020-07-06 NOTE — Anesthesia Preprocedure Evaluation (Addendum)
Anesthesia Evaluation  Patient identified by MRN, date of birth, ID band Patient awake    Reviewed: Allergy & Precautions, NPO status , Patient's Chart, lab work & pertinent test results  Airway Mallampati: III  TM Distance: >3 FB Neck ROM: Full  Mouth opening: Limited Mouth Opening Comment: Limited mouth opening possibly d/t poor pt cooperation Dental  (+) Dental Advisory Given, Poor Dentition   Pulmonary Current Smoker and Patient abstained from smoking.,    Pulmonary exam normal breath sounds clear to auscultation       Cardiovascular negative cardio ROS Normal cardiovascular exam Rhythm:Regular Rate:Normal     Neuro/Psych PSYCHIATRIC DISORDERS Depression Suicide attempt earlier today- b/l wrists cut negative neurological ROS     GI/Hepatic negative GI ROS, (+)     substance abuse (last alcohol 2d ago, denies daily use)  alcohol use, UDS positive for opiates  LFTs slightly elevated, plt 132   Endo/Other  negative endocrine ROS  Renal/GU negative Renal ROS  negative genitourinary   Musculoskeletal negative musculoskeletal ROS (+)   Abdominal   Peds  Hematology negative hematology ROS (+)   Anesthesia Other Findings   Reproductive/Obstetrics negative OB ROS                          Anesthesia Physical Anesthesia Plan  ASA: III  Anesthesia Plan: General   Post-op Pain Management:    Induction: Intravenous  PONV Risk Score and Plan: 1 and Ondansetron, Dexamethasone, Midazolam and Treatment may vary due to age or medical condition  Airway Management Planned: LMA  Additional Equipment: None  Intra-op Plan:   Post-operative Plan: Extubation in OR  Informed Consent: I have reviewed the patients History and Physical, chart, labs and discussed the procedure including the risks, benefits and alternatives for the proposed anesthesia with the patient or authorized representative who  has indicated his/her understanding and acceptance.     Dental advisory given  Plan Discussed with: CRNA  Anesthesia Plan Comments: (B/L wrist wounds with L worse than R. Will need tourniquet on left arm, so will place IV in right [facility] )      Anesthesia Quick Evaluation

## 2020-07-06 NOTE — Progress Notes (Signed)
Patient underwent repair of bilateral wrist lacerations today in the operating room.  Right wrist had no tendon or nerve involvement and the skin repair was performed.  This dressing can be removed and changed as needed or recommend keeping it covered and protected while healing takes place.  On the left side there were 2 wrist flexors that were transected at least partially and the splint is in place to protect extension of the wrist.  Would recommend leaving the splint in place until follow-up.  All of the sutures are developed dissolvable and inability to be removed.  I would like to see him in my office about 2-3 weeks from now to evaluate him.  The splint on the left wrist should stay in place and not be removed.  He should not get the splint wet.  Cleared for discharge from my standpoint

## 2020-07-06 NOTE — ED Notes (Signed)
Report given to yellow zone RN

## 2020-07-06 NOTE — Brief Op Note (Signed)
07/06/2020  8:34 PM  PATIENT:  Peter Fuentes  60 y.o. male  PRE-OPERATIVE DIAGNOSIS:  FALL  POST-OPERATIVE DIAGNOSIS:  FALL  PROCEDURE:  Procedure(s): IRRIGATION AND DEBRIDEMENT OF RIGHT WRIST WITH COMPLEX CLOSURE , IRRIGATION AND DEBRIDEMENT OF LEFT WRIST WITH MEDIAN NERVE REPAIR, TENDON REPAIR X2, COMPLEX CLOSURE (Bilateral)  SURGEON:  Surgeon(s) and Role:    * Rekia Kujala, Wendy Poet, MD - Primary  PHYSICIAN ASSISTANT: None  ASSISTANTS: none   ANESTHESIA:   general  EBL:  15 mL   BLOOD ADMINISTERED:none  DRAINS: none   LOCAL MEDICATIONS USED:  MARCAINE     SPECIMEN:  No Specimen  DISPOSITION OF SPECIMEN:  N/A  COUNTS:  YES  TOURNIQUET:  * Missing tourniquet times found for documented tourniquets in log: [id number] *  DICTATION: .Dragon Dictation  PLAN OF CARE: Admit to inpatient   PATIENT DISPOSITION:  PACU - hemodynamically stable.   Delay start of Pharmacological VTE agent (>24hrs) due to surgical blood loss or risk of bleeding: not applicable

## 2020-07-07 ENCOUNTER — Telehealth: Payer: Self-pay | Admitting: Plastic Surgery

## 2020-07-07 ENCOUNTER — Encounter (HOSPITAL_COMMUNITY): Payer: Self-pay | Admitting: Plastic Surgery

## 2020-07-07 MED ORDER — ACETAMINOPHEN 500 MG PO TABS
1000.0000 mg | ORAL_TABLET | Freq: Once | ORAL | Status: AC
  Administered 2020-07-07: 1000 mg via ORAL
  Filled 2020-07-07: qty 2

## 2020-07-07 NOTE — ED Notes (Signed)
Mother Doree Albee called to ask if the psychiatrist will call her at [phone]

## 2020-07-07 NOTE — Progress Notes (Addendum)
Patient declined by [facility] due to medical complexity. Med Psych beds at [facility] and [facility] remain [place] with no anticipated availability at [facility]. CSW will continue to monitor disposition and update note as more information becomes available.  Signed:  Corky Crafts, MSW, [place], LCASA 07/07/2020 2:20 PM

## 2020-07-07 NOTE — Telephone Encounter (Signed)
Patient's mom called to find out how her son's surgery went and when she can visit him. Her number is [phone].

## 2020-07-07 NOTE — ED Notes (Signed)
Breakfast tray ordered 

## 2020-07-07 NOTE — ED Provider Notes (Signed)
  Physical Exam  BP 131/87   Pulse 91   Temp 98.4 F (36.9 C) (Oral)   Resp 16   SpO2 97%   Physical Exam  ED Course/Procedures     Procedures  MDM  Patient is medically cleared. Ready for psychiatric placement. Follow up with hand in 2-3 weeks. Spint until then       Benjiman Core, MD 07/07/20 1027

## 2020-07-07 NOTE — ED Notes (Signed)
CIWA assessment deferred at this time, pt currently sleeping

## 2020-07-07 NOTE — Progress Notes (Addendum)
ADDENDUM:  CSW received requested information from ED Nurse. CSW has faxed this information directly to [facility] for continued review.  Damita Dunnings, MSW, LCSW-A  10:45 AM 07/07/2020     Pt is currently under review by [facility]. Additional medical information including copies of IVC has been requested, CSW advised RN @ [facility] ED to fax information over.   Damita Dunnings, MSW, LCSW-A  8:57 AM 07/07/2020

## 2020-07-08 ENCOUNTER — Other Ambulatory Visit: Payer: Self-pay

## 2020-07-08 ENCOUNTER — Encounter (HOSPITAL_COMMUNITY): Payer: Self-pay | Admitting: Family

## 2020-07-08 ENCOUNTER — Ambulatory Visit (HOSPITAL_COMMUNITY)
Admission: EM | Admit: 2020-07-08 | Discharge: 2020-07-08 | Disposition: A | Discharge status: Home / Self Care | Payer: No Payment, Other | Attending: Psychiatry | Admitting: Psychiatry

## 2020-07-08 ENCOUNTER — Inpatient Hospital Stay (HOSPITAL_COMMUNITY)
Admission: RE | Admit: 2020-07-08 | Discharge: 2020-07-15 | DRG: 885 | Disposition: A | Discharge status: Home / Self Care | Payer: Federal, State, Local not specified - Other | Attending: Psychiatry | Admitting: Psychiatry

## 2020-07-08 DIAGNOSIS — Z79899 Other long term (current) drug therapy: Secondary | ICD-10-CM | POA: Diagnosis not present

## 2020-07-08 DIAGNOSIS — F419 Anxiety disorder, unspecified: Secondary | ICD-10-CM | POA: Diagnosis present

## 2020-07-08 DIAGNOSIS — D696 Thrombocytopenia, unspecified: Secondary | ICD-10-CM | POA: Diagnosis present

## 2020-07-08 DIAGNOSIS — F1020 Alcohol dependence, uncomplicated: Secondary | ICD-10-CM | POA: Diagnosis present

## 2020-07-08 DIAGNOSIS — R7989 Other specified abnormal findings of blood chemistry: Secondary | ICD-10-CM | POA: Diagnosis present

## 2020-07-08 DIAGNOSIS — Z8616 Personal history of COVID-19: Secondary | ICD-10-CM

## 2020-07-08 DIAGNOSIS — T1491XA Suicide attempt, initial encounter: Secondary | ICD-10-CM

## 2020-07-08 DIAGNOSIS — F322 Major depressive disorder, single episode, severe without psychotic features: Secondary | ICD-10-CM | POA: Diagnosis not present

## 2020-07-08 DIAGNOSIS — F17210 Nicotine dependence, cigarettes, uncomplicated: Secondary | ICD-10-CM | POA: Diagnosis present

## 2020-07-08 DIAGNOSIS — Z20822 Contact with and (suspected) exposure to covid-19: Secondary | ICD-10-CM | POA: Diagnosis present

## 2020-07-08 DIAGNOSIS — X789XXA Intentional self-harm by unspecified sharp object, initial encounter: Secondary | ICD-10-CM | POA: Diagnosis present

## 2020-07-08 DIAGNOSIS — K219 Gastro-esophageal reflux disease without esophagitis: Secondary | ICD-10-CM | POA: Diagnosis present

## 2020-07-08 DIAGNOSIS — G4700 Insomnia, unspecified: Secondary | ICD-10-CM | POA: Diagnosis present

## 2020-07-08 DIAGNOSIS — Z9151 Personal history of suicidal behavior: Secondary | ICD-10-CM

## 2020-07-08 DIAGNOSIS — Z9152 Personal history of nonsuicidal self-harm: Secondary | ICD-10-CM

## 2020-07-08 DIAGNOSIS — F332 Major depressive disorder, recurrent severe without psychotic features: Secondary | ICD-10-CM | POA: Insufficient documentation

## 2020-07-08 DIAGNOSIS — Y907 Blood alcohol level of 200-239 mg/100 ml: Secondary | ICD-10-CM | POA: Diagnosis present

## 2020-07-08 LAB — SARS CORONAVIRUS 2 (TAT 6-24 HRS): SARS Coronavirus 2: NEGATIVE

## 2020-07-08 MED ORDER — ACETAMINOPHEN 325 MG PO TABS: 650.0000 mg | ORAL_TABLET | Freq: Four times a day (QID) | ORAL | Status: DC | PRN

## 2020-07-08 MED ORDER — HYDROXYZINE HCL 25 MG PO TABS
25.0000 mg | ORAL_TABLET | Freq: Three times a day (TID) | ORAL | Status: DC | PRN
  Administered 2020-07-08: 25 mg via ORAL
  Filled 2020-07-08: qty 1

## 2020-07-08 MED ORDER — LORAZEPAM 1 MG PO TABS: 0.0000 mg | ORAL_TABLET | Freq: Two times a day (BID) | ORAL | Status: DC

## 2020-07-08 MED ORDER — ALUM & MAG HYDROXIDE-SIMETH 200-200-20 MG/5ML PO SUSP: 30.0000 mL | ORAL | Status: DC | PRN

## 2020-07-08 MED ORDER — TRAZODONE HCL 50 MG PO TABS
50.0000 mg | ORAL_TABLET | Freq: Every evening | ORAL | Status: DC | PRN
  Administered 2020-07-08 – 2020-07-13 (×4): 50 mg via ORAL
  Filled 2020-07-08: qty 1
  Filled 2020-07-08: qty 7
  Filled 2020-07-08 (×3): qty 1

## 2020-07-08 MED ORDER — THIAMINE HCL 100 MG PO TABS
100.0000 mg | ORAL_TABLET | Freq: Every day | ORAL | Status: DC
  Administered 2020-07-10 – 2020-07-15 (×6): 100 mg via ORAL
  Filled 2020-07-08 (×9): qty 1

## 2020-07-08 MED ORDER — MAGNESIUM HYDROXIDE 400 MG/5ML PO SUSP: 30.0000 mL | Freq: Every day | ORAL | Status: DC | PRN

## 2020-07-08 MED ORDER — LORAZEPAM 2 MG/ML IJ SOLN: 0.0000 mg | Freq: Two times a day (BID) | INTRAMUSCULAR | Status: DC

## 2020-07-08 MED ORDER — THIAMINE HCL 100 MG/ML IJ SOLN: 100.0000 mg | Freq: Every day | INTRAMUSCULAR | Status: DC

## 2020-07-08 NOTE — ED Notes (Signed)
Left a message for Officer Wendall Stade regarding pts transfer to [facility]

## 2020-07-08 NOTE — ED Notes (Signed)
Bandages in place to bilat upper extremeties. nml c/s, no complaints, no s/s infection

## 2020-07-08 NOTE — Progress Notes (Signed)
Patient information has been sent to [facility] via secure chat to review for potential admission. Patient meets inpatient criteria per [name], NP.   Situation ongoing, CSW will continue to monitor progress.    Signed:  Damita Dunnings, MSW, LCSW-A  07/08/2020 10:19 AM

## 2020-07-08 NOTE — ED Notes (Signed)
Received verbal report from Janis B at this time 

## 2020-07-08 NOTE — ED Notes (Signed)
I have not received a return call back from officer Wendall Stade to transfer pt to [facility], oncoming RN Bonita Quin aware.  I spoke with the secretary in [place] zone and she states she will reach officer Wendall Stade and notify Bonita Quin,  She is aware

## 2020-07-08 NOTE — Discharge Instructions (Addendum)
  Patient to be transferred to [facility] for inpatient psychiatric treatment 

## 2020-07-08 NOTE — BH Assessment (Addendum)
Disposition:   Followed up with status of patient's transport to [facility] for inpatient psychiatric treatment. Spoke to patient's nurse, Thayer Ohm, RN. Stated that he would send patient to [facility].  Upon chart review: Patient accepted to [facility] (adult unit) by Berneice Heinrich, NP. The attending provider is Dr. Jola Babinski. Room assignment 300-1. Nurse report [phone]. Provided acceptance details to    Patient to sign voluntary admission form prior to transfer. Fax voluntary admissions document to [facility] [phone].   Nursing to arrange transport and send asap to [facility].

## 2020-07-08 NOTE — BH Assessment (Signed)
This Probation officer met with patient this date to assess current mental health status. Patient reports ongoing passive S/I stating "does it matter if I live or not" when asked in reference to self harm. Patient will not elaborate on content of statement and is observed to be speaking in a very low voice that is difficult to understand. Patient shakes his head "no" when asked in reference to H/I or AVH. Patient renders limited history and has limited eye contact and interaction with this Probation officer. Patient will not answer any other questions as he has his lunch. Per Hall Busing NP patient contnues to meet continued inpatient criteria as bed placement is investigated.

## 2020-07-08 NOTE — ED Notes (Signed)
Provider at bedside at this time

## 2020-07-08 NOTE — Progress Notes (Signed)
Pt accepted to [facility] 300-1  Patient meets inpatient criteria per [name], NP.    Dr. Jola Babinski is the attending provider.    Call report to [phone]    Wallene Dales [facility] ED notified.     Pt scheduled  to arrive at [facility] at 1300 PM on 07/08/2020  Damita Dunnings, MSW, LCSW-A  1:02 PM 07/08/2020

## 2020-07-08 NOTE — ED Provider Notes (Signed)
Behavioral Health Urgent Care Medical Screening Exam  Patient Name: Peter Fuentes MRN: [id number] Date of Evaluation: 07/08/20 Chief Complaint:  Depression Diagnosis:  Final diagnoses:  MDD (major depressive disorder), recurrent severe, without psychosis (HCC)   Disposition: Inpatient psychiatric treatment has been recommended for this patient (see HPI for further details if necessary).  Patient was transferred to [facility] from the [facility] ED by accident.  Patient was supposed to be transferred to [facility] (this was confirmed by Peter Fuentes, [facility]). Based on my evaluation, believe that patient is stable for transfer to [facility].  Patient to be transferred to [facility] via law enforcement to begin his inpatient psychiatric treatment stay. EMTALA form completed.   History of Present illness: Peter Fuentes is a 60 y.o. male who presents to [facility] from [facility] emergency department under IVC.  Patient was transferred to [facility] from the ED by mistake.  Patient is supposed to be transferred to [facility] to begin inpatient psychiatric treatment.  Upon patient's arrival to [facility], [facility] confirmed this by nursing staff spoke with Peter Fuentes, [facility], who confirmed that the patient is supposed to be transferred to [facility] to begin inpatient psychiatric treatment.  Per chart review, patient presented to the [facility] emergency department on 07/06/2020 for potential suicide attempt via cutting his bilateral wrists (Chart review shows patient was found on the floor by family member with lacerations to bilateral wrists).  It appears that patient had an orthopedic procedure done on 07/06/2020 due to the lacerations on the patient's bilateral wrists (07/06/2020 Op note shows that patient had 5 different procedures done for these lacerations- see notes in patient's chart for further details if necessary) Patient was assessed by TTS on 07/06/2020 and inpatient psychiatric treatment was recommended for the patient by Peter Heater, NP.  Please see  past notes in the patient's chart for further details if necessary.  Patient seen and examined by myself upon his arrival to [facility].  Patient reports that he is being admitted to [facility] because he cut his bilateral wrists a few days ago with a razor blade.  When patient is asked if this cutting episode was a suicide attempt, patient states "kind of" and then states that he is not sure if it was a suicide attempt.  However, per chart review 07/06/2020 Peter Fuentes TTS note, stated that this was a suicide attempt.  Patient denies any past history of intentionally cutting or burning himself prior to this 07/06/2020 cutting incident.  Patient denies SI, HI, AVH, paranoia, or delusions on exam.  Patient states that he is not currently taking any psychotropic medications and has never taken psychotropic medications in the past.  Patient states that he is not currently seeing an outpatient psychiatrist or therapist.  Patient denies any history of receiving inpatient psychiatric treatment in the past.  Patient denies any tobacco or illicit drug use.  When patient is asked about alcohol use, patient states that he has not used alcohol "for 100 years".  However, in 07/06/2020 TTS note, patient's last use of alcohol was noted to be on 07/05/2020 and his ethanol level on 07/06/2020 was elevated at 210 mg/dL.  Patient denies history of alcohol withdrawal symptoms or seizures.  Psychiatric Specialty Exam  Presentation  General Appearance:Appropriate for Environment; Disheveled  Eye Contact:Good  Speech:Clear and Coherent; Normal Rate  Speech Volume:Normal  Handedness:No data recorded  Mood and Affect  Mood:Depressed  Affect:Congruent   Thought Process  Thought Processes:Coherent; Goal Directed; Linear  Descriptions of Associations:Intact  Orientation:Full (Time, Place and Person)  Thought Content:WDL  Diagnosis of Schizophrenia or Schizoaffective disorder in past: No   Hallucinations:None  Ideas of  Reference:None  Suicidal Thoughts:No  Homicidal Thoughts:No   Sensorium  Memory:Immediate Fair; Recent Fair; Remote Fair  Judgment:Fair  Insight:Fair   Executive Functions  Concentration:Good  Attention Span:Good  Recall:Good  Fund of Knowledge:Good  Language:Good   Psychomotor Activity  Psychomotor Activity:Normal   Assets  Assets:Communication Skills; Desire for Improvement; Leisure Time; Physical Health; Housing   Sleep  Sleep:Fair  Number of hours: No data recorded   Physical Exam: Physical Exam Vitals reviewed.  Constitutional:      General: He is not in acute distress.    Appearance: He is not ill-appearing, toxic-appearing or diaphoretic.  HENT:     Head: Normocephalic and atraumatic.     Right Ear: External ear normal.     Left Ear: External ear normal.  Cardiovascular:     Rate and Rhythm: Normal rate and regular rhythm.  Pulmonary:     Effort: Pulmonary effort is normal. No respiratory distress.  Musculoskeletal:        General: Normal range of motion.     Cervical back: Normal range of motion.     Comments: Patient's bilateral hand/wrist/forearms wrapped in bandages.   Neurological:     Mental Status: He is alert and oriented to person, place, and time.     Comments: No tremor noted.   Psychiatric:        Attention and Perception: Attention normal. He does not perceive auditory or visual hallucinations.        Mood and Affect: Mood is depressed.        Speech: Speech normal.        Behavior: Behavior normal. Behavior is not agitated, slowed, aggressive, withdrawn, hyperactive or combative. Behavior is cooperative.        Thought Content: Thought content is not paranoid or delusional. Thought content does not include homicidal or suicidal ideation.     Comments: Affect mood congruent.     Review of Systems  Constitutional: Positive for malaise/fatigue. Negative for chills, diaphoresis, fever and weight loss.  HENT: Negative for  congestion.   Respiratory: Negative for cough and shortness of breath.   Cardiovascular: Negative for chest pain and palpitations.  Gastrointestinal: Negative for abdominal pain, constipation, diarrhea, nausea and vomiting.  Musculoskeletal: Negative for joint pain and myalgias.  Neurological: Negative for dizziness, tremors, seizures and headaches.  Psychiatric/Behavioral: Positive for depression and suicidal ideas. Negative for hallucinations and memory loss. The patient is not nervous/anxious.        Negative for HI or AVH.      Vitals: Blood pressure 121/78, pulse 80, temperature 98.6 F (37 C), resp. rate 18, SpO2 98 %. There is no height or weight on file to calculate BMI.  Musculoskeletal: Strength & Muscle Tone: within normal limits Gait & Station: normal Patient leans: N/A   [facility] MSE Discharge Disposition for Follow up and Recommendations: Based on my evaluation I certify that psychiatric inpatient services furnished can reasonably be expected to improve the patient's condition which I recommend transfer to an appropriate accepting facility.   Inpatient psychiatric treatment has been recommended for this patient (see HPI for further details if necessary).  Patient was transferred to [facility] from the [facility] ED by accident.  Patient was supposed to be transferred to [facility] (this was confirmed by Peter Fuentes, [facility]). Based on my evaluation, believe that patient is stable for  transfer to [facility].  Patient to be transferred to [facility] via law enforcement to begin his inpatient psychiatric treatment stay. EMTALA form completed.    Jaclyn Shaggy, PA-C 07/08/2020, 9:16 PM

## 2020-07-08 NOTE — ED Notes (Signed)
Attempted to call report advised someone would call me right back for report

## 2020-07-08 NOTE — ED Notes (Signed)
Received a call from pt brother Bellamy Judson [phone]. Family member upset stating that no one has called them and updated them with any information in reference to the pt care. Also stated that he wanted to speak with someone hire up and that he was going to have this looked into. Advised caller that if the pt does not want them to have any information then legally we can't provide any. Spoke with charge and made aware. Also spoke with pt and allowed pt to contact family before he left

## 2020-07-08 NOTE — ED Notes (Signed)
Report given to Vesta Mixer, RN of [facility].

## 2020-07-08 NOTE — TOC CAGE-AID Note (Signed)
Transition of Care [facility]) - CAGE-AID Screening   Patient Details  Name: Peter Fuentes MRN: [id number] Date of Birth: 08/27/1960  Transition of [facility]) CM/SW Contact:    Janora Norlander, RN Phone Number: [phone] 07/08/2020, 12:43 PM   Clinical Narrative: Pt does admit to drinking socially but denies the need for resources for drugs or alcohol.   CAGE-AID Screening:    Have You Ever Felt You Ought to Cut Down on Your Drinking or Drug Use?: No Have People Annoyed You By Critizing Your Drinking Or Drug Use?: No Have You Felt Bad Or Guilty About Your Drinking Or Drug Use?: No Have You Ever Had a Drink or Used Drugs First Thing In The Morning to Steady Your Nerves or to Get Rid of a Hangover?: No CAGE-AID Score: 0  Substance Abuse Education Offered: No

## 2020-07-08 NOTE — ED Notes (Signed)
Mother Doree Albee [phone] would like an update on the patient in terms of what the next steps are, as well would like to speak to the doctor/psychiartist

## 2020-07-08 NOTE — ED Notes (Signed)
Spoke with provider reference documentation needed to transfer pt to [facility]

## 2020-07-09 DIAGNOSIS — T1491XA Suicide attempt, initial encounter: Secondary | ICD-10-CM

## 2020-07-09 DIAGNOSIS — F322 Major depressive disorder, single episode, severe without psychotic features: Secondary | ICD-10-CM | POA: Diagnosis not present

## 2020-07-09 LAB — HEPATIC FUNCTION PANEL
ALT: 76 U/L — ABNORMAL HIGH (ref 0–44)
AST: 74 U/L — ABNORMAL HIGH (ref 15–41)
Albumin: 3.9 g/dL (ref 3.5–5.0)
Alkaline Phosphatase: 62 U/L (ref 38–126)
Bilirubin, Direct: 0.2 mg/dL (ref 0.0–0.2)
Indirect Bilirubin: 0.7 mg/dL (ref 0.3–0.9)
Total Bilirubin: 0.9 mg/dL (ref 0.3–1.2)
Total Protein: 7 g/dL (ref 6.5–8.1)

## 2020-07-09 LAB — CBC WITH DIFFERENTIAL/PLATELET
Abs Immature Granulocytes: 0.02 10*3/uL (ref 0.00–0.07)
Basophils Absolute: 0 10*3/uL (ref 0.0–0.1)
Basophils Relative: 0 %
Eosinophils Absolute: 0.1 10*3/uL (ref 0.0–0.5)
Eosinophils Relative: 2 %
HCT: 42.3 % (ref 39.0–52.0)
Hemoglobin: 14.2 g/dL (ref 13.0–17.0)
Immature Granulocytes: 0 %
Lymphocytes Relative: 27 %
Lymphs Abs: 1.2 10*3/uL (ref 0.7–4.0)
MCH: 35.1 pg — ABNORMAL HIGH (ref 26.0–34.0)
MCHC: 33.6 g/dL (ref 30.0–36.0)
MCV: 104.4 fL — ABNORMAL HIGH (ref 80.0–100.0)
Monocytes Absolute: 0.6 10*3/uL (ref 0.1–1.0)
Monocytes Relative: 14 %
Neutro Abs: 2.5 10*3/uL (ref 1.7–7.7)
Neutrophils Relative %: 57 %
Platelets: 122 10*3/uL — ABNORMAL LOW (ref 150–400)
RBC: 4.05 MIL/uL — ABNORMAL LOW (ref 4.22–5.81)
RDW: 11.7 % (ref 11.5–15.5)
WBC: 4.5 10*3/uL (ref 4.0–10.5)
nRBC: 0 % (ref 0.0–0.2)

## 2020-07-09 LAB — TSH: TSH: 1.472 u[IU]/mL (ref 0.350–4.500)

## 2020-07-09 MED ORDER — LORAZEPAM 1 MG PO TABS: 1.0000 mg | ORAL_TABLET | Freq: Four times a day (QID) | ORAL | Status: DC | PRN

## 2020-07-09 MED ORDER — HYDROXYZINE HCL 25 MG PO TABS
25.0000 mg | ORAL_TABLET | Freq: Four times a day (QID) | ORAL | Status: AC | PRN
  Administered 2020-07-09 – 2020-07-11 (×3): 25 mg via ORAL
  Filled 2020-07-09 (×3): qty 1

## 2020-07-09 MED ORDER — ADULT MULTIVITAMIN W/MINERALS CH
1.0000 | ORAL_TABLET | Freq: Every day | ORAL | Status: DC
  Administered 2020-07-10 – 2020-07-15 (×6): 1 via ORAL
  Filled 2020-07-09 (×9): qty 1

## 2020-07-09 MED ORDER — LOPERAMIDE HCL 2 MG PO CAPS: 2.0000 mg | ORAL_CAPSULE | ORAL | Status: AC | PRN

## 2020-07-09 MED ORDER — MIRTAZAPINE 15 MG PO TABS
15.0000 mg | ORAL_TABLET | Freq: Every day | ORAL | Status: DC
  Administered 2020-07-09 – 2020-07-14 (×6): 15 mg via ORAL
  Filled 2020-07-09: qty 1
  Filled 2020-07-09: qty 7
  Filled 2020-07-09 (×7): qty 1

## 2020-07-09 MED ORDER — BACITRACIN-NEOMYCIN-POLYMYXIN 400-5-5000 EX OINT
TOPICAL_OINTMENT | Freq: Every day | CUTANEOUS | Status: DC
  Administered 2020-07-11 – 2020-07-14 (×2): 1 via TOPICAL
  Filled 2020-07-09 (×3): qty 1

## 2020-07-09 MED ORDER — THIAMINE HCL 100 MG PO TABS: 100.0000 mg | ORAL_TABLET | Freq: Every day | ORAL | Status: DC

## 2020-07-09 MED ORDER — ONDANSETRON 4 MG PO TBDP: 4.0000 mg | ORAL_TABLET | Freq: Four times a day (QID) | ORAL | Status: AC | PRN

## 2020-07-09 MED ORDER — MIRTAZAPINE 7.5 MG PO TABS
7.5000 mg | ORAL_TABLET | Freq: Every day | ORAL | Status: DC
  Filled 2020-07-09: qty 1

## 2020-07-09 NOTE — H&P (Signed)
Psychiatric Admission Assessment Adult  Patient Identification: Peter Fuentes MRN:  [id number] Date of Evaluation:  07/09/2020 Chief Complaint:  MDD (major depressive disorder), single episode, severe (HCC) [F32.2] Principal Diagnosis: MDD (major depressive disorder), single episode, severe (HCC) Diagnosis:  Principal Problem:   MDD (major depressive disorder), single episode, severe (HCC) Active Problems:   Suicidal behavior with attempted self-injury (HCC)  History of Present Illness: Peter Fuentes is 60 year old male. [facility] with complaint of suicidal attempt. Per chart review, patient attempted to commit suicide by cutting his bilateral wrist after his mother and step-dad informed him about their plans to relocate to another state. Patient was found lying face down in his bed by his mother. Patient was brought to [facility] by EMS and was later taken to the OR for surgery.   Patient was assessed upon [facility] to [facility]. Patient is alert and oriented X4 and able to fully participate in assessment. Patient's speech is clear and coherent, mood is euthymic and his affect is congruent with mood. He his noted with bilateral wound dressings to both forearms. His left arm is in a splint wrapped with ace band aid.    Patient reported "I tried to hurt myself because I have no way out. I moved from [place] to help them and they are leaving." Patient reported that [place] 2 years ago to help take care of his mother and step-dad. He report that his parents were on a waiting list [place] assisted living in [place] and got a called that they were accepted to the assisted living. He report that his mother informed him of their plans to move to [place] and their desire to sell their current home. Patient report that triggered him to cut his wrist because he felt "hopelessness" about his living situation. He states "I have no where to go. I don't know  anyone in this area, and apartments are very extensive and they want 3 times monthly income. I don't have $3000."  Patient denies past psychiatric diagnosis although he was treated at [facility] on 04/10/2019 for ETOH withdrawal. He is also denying hx of self-harming behavior. Patient states "I don't know why I hurt myself. I was impulsive, now I wished I didn't." Patient is denying current suicidal ideation, he denies HI, AVH, paranoia, and no evidence of delusion noted. 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 He states "I deal with stress just like everyone; the cutting was just something I did, I had nothing to do with work."  He admits to drinking 2-3 cans of beers approximately 3 days/week to help him "relax" after work. Patient's blood alcohol level was 210 on admission.   He denies feeling depressed, change in appetite, sleep, energy, concentration, or experiencing depressed mood. He states "I have nothing to be depressed about." when asked why he cut himself he states "I felt a little hopelessness for a moment and I didn't know what to do, I wanted a way out I was impulsive and cut myself with a shaving razor blade." He report that he is not active with a therapist or psychiatrist. He is not current taking any medications. He report that he last used marijuana about 4-5 years ago, he denies ever using any other illicit drug.      Associated Signs/Symptoms:  Depression Symptoms:  hopelessness, Duration of Depression Symptoms: Greater than two weeks  (Hypo) Manic Symptoms:  Impulsivity, Anxiety Symptoms:  N/A Psychotic Symptoms:  N/A PTSD Symptoms: NA Total Time spent with patient: 30 minutes  Past Psychiatric History: ETOH  Is the patient at risk to self? Yes.    Has the patient been a risk to self in the past 6 months? No.  Has the patient been a risk to self within  the distant past? No.  Is the patient a risk to others? No.  Has the patient been a risk to others in the past 6 months? No.  Has the patient been a risk to others within the distant past? No.   Prior Inpatient Therapy:   Prior Outpatient Therapy:    Alcohol Screening: 1. How often do you have a drink containing alcohol?: 2 to 3 times a week 2. How many drinks containing alcohol do you have on a typical day when you are drinking?: 3 or 4 3. How often do you have six or more drinks on one occasion?: Never AUDIT-C Score: 4 4. How often during the last year have you found that you were not able to stop drinking once you had started?: Never 5. How often during the last year have you failed to do what was normally expected from you because of drinking?: Never 6. How often during the last year have you needed a first drink in the morning to get yourself going after a heavy drinking session?: Never 7. How often during the last year have you had a feeling of guilt of remorse after drinking?: Never 8. How often during the last year have you been unable to remember what happened the night before because you had been drinking?: Never 9. Have you or someone else been injured as a result of your drinking?: No 10. Has a relative or friend or a doctor or another health worker been concerned about your drinking or suggested you cut down?: No Alcohol Use Disorder Identification Test Final Score (AUDIT): 4 Substance Abuse History in the last 12 months:  No. Consequences of Substance Abuse: Negative Previous Psychotropic Medications: No  Psychological Evaluations: No  Past Medical History:  Past Medical History:  Diagnosis Date  . COVID-19 01/2018  . Depression     Past Surgical History:  Procedure Laterality Date  . I & D EXTREMITY Bilateral 07/06/2020   Procedure: IRRIGATION AND DEBRIDEMENT OF RIGHT WRIST WITH COMPLEX CLOSURE , IRRIGATION AND DEBRIDEMENT OF LEFT WRIST WITH MEDIAN NERVE REPAIR, TENDON  REPAIR X2, COMPLEX CLOSURE;  Surgeon: Allena Napoleon, MD;  Location: [facility] OR;  Service: Plastics;  Laterality: Bilateral;   Family History: History reviewed. No pertinent family history. Family Psychiatric  History:  Tobacco Screening: Have you used any form of tobacco in the last 30 days? (Cigarettes, Smokeless Tobacco, Cigars, and/or Pipes): Yes Tobacco use, Select all that apply: 5 or more cigarettes per day Are you interested in Tobacco Cessation Medications?: No, patient refused Counseled patient on smoking cessation including recognizing danger situations, developing coping skills and basic information about quitting provided: Refused/Declined practical counseling Social History:  Social History   Substance and Sexual Activity  Alcohol Use Yes   Comment: 8     Social History   Substance and Sexual Activity  Drug Use Yes  . Types: Oxycodone    Additional Social History:  Allergies:  No Known Allergies Lab Results:  Results for orders placed or performed during the hospital encounter of 07/06/20 (from the past 48 hour(s))  SARS CORONAVIRUS 2 (TAT 6-24 HRS) Nasopharyngeal Nasopharyngeal Swab     Status: None   Collection Time: 07/08/20  1:15 PM   Specimen: Nasopharyngeal Swab  Result Value Ref Range   SARS Coronavirus 2 NEGATIVE NEGATIVE    Comment: (NOTE) SARS-CoV-2 target nucleic acids are NOT DETECTED.  The SARS-CoV-2 RNA is generally detectable in upper and lower respiratory specimens during the acute phase of infection. Negative results do not preclude SARS-CoV-2 infection, do not rule out co-infections with other pathogens, and should not be used as the sole basis for treatment or other patient management decisions. Negative results must be combined with clinical observations, patient history, and epidemiological information. The expected result is Negative.  Fact Sheet for  Patients: HairSlick.no  Fact Sheet for Healthcare Providers: quierodirigir.com  This test is not yet approved or cleared by the [place] FDA and  has been authorized for detection and/or diagnosis of SARS-CoV-2 by FDA under an Emergency Use Authorization (EUA). This EUA will remain  in effect (meaning this test can be used) for the duration of the COVID-19 declaration under Se ction 564(b)(1) of the Act, 21 U.S.C. section 360bbb-3(b)(1), unless the authorization is terminated or revoked sooner.  Performed at [facility], [address] [place], [place] [address]     Blood Alcohol level:  Lab Results  Component Value Date   ETH 210 (H) 07/06/2020   ETH 137 (H) 04/10/2019    Metabolic Disorder Labs:  No results found for: HGBA1C, MPG No results found for: PROLACTIN No results found for: CHOL, TRIG, HDL, CHOLHDL, VLDL, LDLCALC  Current Medications: Current Facility-Administered Medications  Medication Dose Route Frequency Provider Last Rate Last Admin  . acetaminophen (TYLENOL) tablet 650 mg  650 mg Oral Q6H PRN Lenard Lance, FNP      . alum & mag hydroxide-simeth (MAALOX/MYLANTA) 200-200-20 MG/5ML suspension 30 mL  30 mL Oral Q4H PRN Lenard Lance, FNP      . hydrOXYzine (ATARAX/VISTARIL) tablet 25 mg  25 mg Oral TID PRN Lenard Lance, FNP   25 mg at 07/08/20 2311  . LORazepam (ATIVAN) injection 0-4 mg  0-4 mg Intravenous Q12H Lenard Lance, FNP       Or  . LORazepam (ATIVAN) tablet 0-4 mg  0-4 mg Oral Q12H Lenard Lance, FNP      . magnesium hydroxide (MILK OF MAGNESIA) suspension 30 mL  30 mL Oral Daily PRN Lenard Lance, FNP      . thiamine tablet 100 mg  100 mg Oral Daily Lenard Lance, FNP       Or  . thiamine (B-1) injection 100 mg  100 mg Intravenous Daily Lenard Lance, FNP      . traZODone (DESYREL) tablet 50 mg  50 mg Oral QHS PRN Lenard Lance, FNP   50 mg at 07/08/20 2311   PTA Medications: No  medications prior to admission.    Musculoskeletal: Strength & Muscle Tone: decreased Gait & Station: unsteady Patient leans: Left            Psychiatric Specialty Exam:  Presentation  General Appearance: Appropriate for Environment; Disheveled  Eye Contact:Good  Speech:Clear and Coherent; Normal Rate  Speech Volume:Normal  Handedness:No data recorded  Mood and Affect  Mood:Depressed  Affect:Congruent   Thought Process  Thought Processes:Coherent;  Goal Directed; Linear  Duration of Psychotic Symptoms: No data recorded Past Diagnosis of Schizophrenia or Psychoactive disorder: No  Descriptions of Associations:Intact  Orientation:Full (Time, Place and Person)  Thought Content:WDL  Hallucinations:Hallucinations: None  Ideas of Reference:None  Suicidal Thoughts:Suicidal Thoughts: No  Homicidal Thoughts:Homicidal Thoughts: No   Sensorium  Memory:Immediate Fair; Recent Fair; Remote Fair  Judgment:Fair  Insight:Fair   Executive Functions  Concentration:Good  Attention Span:Good  Recall:Good  Fund of Knowledge:Good  Language:Good   Psychomotor Activity  Psychomotor Activity:Psychomotor Activity: Normal   Assets  Assets:Communication Skills; Desire for Improvement; Leisure Time; Physical Health; Housing   Sleep  Sleep:Sleep: Fair    Physical Exam: Physical Exam Constitutional:      General: He is not in acute distress.    Appearance: He is not toxic-appearing.  HENT:     Head: Normocephalic.     Mouth/Throat:     Mouth: Mucous membranes are moist.  Eyes:     General:        Right eye: No discharge.        Left eye: No discharge.  Cardiovascular:     Rate and Rhythm: Normal rate.  Pulmonary:     Effort: Pulmonary effort is normal. No respiratory distress.     Breath sounds: Normal breath sounds.  Musculoskeletal:        General: Signs of injury (bilateral wrist) present.     Cervical back: Normal range of motion.   Skin:    General: Skin is warm and dry.     Coloration: Skin is not jaundiced or pale.  Neurological:     Mental Status: He is alert and oriented to person, place, and time.     Sensory: No sensory deficit.  Psychiatric:        Attention and Perception: Attention normal. He does not perceive auditory or visual hallucinations.        Mood and Affect: Mood normal.        Speech: Speech normal.        Behavior: Behavior normal. Behavior is cooperative.        Thought Content: Thought content normal. Thought content is not paranoid or delusional. Thought content does not include homicidal or suicidal ideation. Thought content does not include homicidal or suicidal plan.        Cognition and Memory: Cognition normal.        Judgment: Judgment is impulsive.    Review of Systems  Constitutional: Negative.   HENT: Negative.   Eyes: Negative.   Respiratory: Negative.   Cardiovascular: Negative.   Gastrointestinal: Negative.   Genitourinary: Negative.   Musculoskeletal: Negative.   Skin: Negative.   Neurological: Negative.   Endo/Heme/Allergies: Negative.   Psychiatric/Behavioral: Negative for depression, hallucinations, memory loss, substance abuse and suicidal ideas. The patient is nervous/anxious. The patient does not have insomnia.    Blood pressure 126/89, pulse 85, temperature 99.3 F (37.4 C), temperature source Oral, resp. rate 18, height 6' 0.25" (1.835 m), weight 68.5 kg, SpO2 95 %. Body mass index is 20.34 kg/m.  Treatment Plan Summary: Daily contact with patient to assess and evaluate symptoms and progress in treatment  Observation Level/Precautions:  15 minute checks  Laboratory:  CBC Chemistry Profile UDS  Psychotherapy:    Medications:    Consultations:    Discharge Concerns:    Estimated LOS:  Other:     Physician Treatment Plan for Primary Diagnosis: MDD (major depressive disorder), single episode, severe (HCC) Long Term Goal(s): Improvement in symptoms  so as  ready for discharge  Short Term Goals: Ability to identify changes in lifestyle to reduce recurrence of condition will improve, Ability to verbalize feelings will improve, Ability to disclose and discuss suicidal ideas and Ability to demonstrate self-control will improve  Physician Treatment Plan for Secondary Diagnosis: Principal Problem:   MDD (major depressive disorder), single episode, severe (HCC) Active Problems:   Suicidal behavior with attempted self-injury (HCC)  Long Term Goal(s): Improvement in symptoms so as ready for discharge  Short Term Goals: Ability to identify changes in lifestyle to reduce recurrence of condition will improve, Ability to verbalize feelings will improve, Ability to disclose and discuss suicidal ideas, Ability to demonstrate self-control will improve, Ability to identify and develop effective coping behaviors will improve, Ability to maintain clinical measurements within normal limits will improve and Compliance with prescribed medications will improve  I certify that inpatient services furnished can reasonably be expected to improve the patient's condition.    Maricela Bo, NP 5/28/20223:17 AM

## 2020-07-09 NOTE — Progress Notes (Signed)
   07/09/20 0500  Sleep  Number of Hours 4.75

## 2020-07-09 NOTE — Progress Notes (Signed)
   07/09/20 2238  COVID-19 Daily Checkoff  Have you had a fever (temp > 37.80C/100F)  in the past 24 hours?  No  If you have had runny nose, nasal congestion, sneezing in the past 24 hours, has it worsened? No  COVID-19 EXPOSURE  Have you traveled outside the state in the past 14 days? No  Have you been in contact with someone with a confirmed diagnosis of COVID-19 or PUI in the past 14 days without wearing appropriate PPE? No  Have you been living in the same home as a person with confirmed diagnosis of COVID-19 or a PUI (household contact)? No  Have you been diagnosed with COVID-19? No

## 2020-07-09 NOTE — Progress Notes (Signed)
Patient ID: Peter Fuentes, male   DOB: 1960/10/22, 60 y.o.   MRN: [id number]  Admission Note:  D:60 yr male who presents IVC in no acute distress for the treatment of SI and Depression. Pt appears flat and depressed. Pt was calm and cooperative with admission process. Pt a little agitated with his situation and pt does not know where he will be going or living in the near future. Pt appears to minimize his drinking and situation , as pt does not elaborate fully about what is going on. Pt stated his family is moving out at he has nowhere to go as he is from [place] and been here for 2 yrs, when asked why he came , pt stated he was there 25 yrs and needed a change. Pt has 2 bandages from lacerations, that may need to be evaluated by surgeon.    Per Assessment: Per hospital report, Pt's mother found Pt lying face down on his bed with both wrists cuts and a blade on the bed.  She called EMS.  [facility].  Pt admitted that he attempted suicide by cutting.  He reported that he has been depressed for about a month due to conflict with family.  Pt stated that he has felt depressed, and as a result, he attempted to kill himself.  Pt denied past attempts.  He also denied hallucination, homicidal ideation, self-injurious behavior, and substance use concerns.  Pt said he is not concerned about his alcohol use and that he drinks socially.  It may be that Pt minimizes alcohol use -- BAC on admission was 210.  Pt's UDS was positive for opioids.     A:Skin was assessed and found to be clear of any abnormal marks. Pt has bilateral wraps from multiple lacerations that were sutured. Per surgeon not they do not need to be removed until pt visits . PT searched and no contraband found, POC and unit policies explained and understanding verbalized. Consents obtained. Food and fluids offered, and fluids accepted.    R:Pt had no additional questions or concerns.

## 2020-07-09 NOTE — Progress Notes (Signed)
Psychoeducational Group Note  Date:  07/09/2020 Time:  2031  Group Topic/Focus:  Wrap-Up Group:   The focus of this group is to help patients review their daily goal of treatment and discuss progress on daily workbooks.  Participation Level: Did Not Attend  Participation Quality:  Not Applicable  Affect:  Not Applicable  Cognitive:  Not Applicable  Insight:  Not Applicable  Engagement in Group: Not Applicable  Additional Comments:  The patient did not attend group this evening.   Hazle Coca S 07/09/2020, 8:31 PM

## 2020-07-09 NOTE — Progress Notes (Signed)
   07/09/20 2249  Psych Admission Type (Psych Patients Only)  Admission Status Involuntary  Psychosocial Assessment  Patient Complaints None  Eye Contact Brief  Facial Expression Flat  Affect Appropriate to circumstance  Speech Logical/coherent  Interaction Assertive  Motor Activity Slow  Appearance/Hygiene Disheveled  Behavior Characteristics Appropriate to situation  Mood Depressed;Pleasant  Thought Process  Coherency WDL  Content WDL  Delusions None reported or observed  Perception WDL  Hallucination None reported or observed  Judgment Impaired  Confusion None  Danger to Self  Current suicidal ideation? Denies  Danger to Others  Danger to Others None reported or observed

## 2020-07-09 NOTE — Tx Team (Signed)
Initial Treatment Plan 07/09/2020 12:08 AM Peter Fuentes [id number]:[id number]    PATIENT STRESSORS: Marital or family conflict Medication change or noncompliance   PATIENT STRENGTHS: General fund of knowledge Motivation for treatment/growth   PATIENT IDENTIFIED PROBLEMS:  risk for suicide  Housing  Family issues  "Living Situation"               DISCHARGE CRITERIA:  Adequate post-discharge living arrangements Improved stabilization in mood, thinking, and/or behavior Verbal commitment to aftercare and medication compliance  PRELIMINARY DISCHARGE PLAN: Attend aftercare/continuing care group Outpatient therapy Placement in alternative living arrangements  PATIENT/FAMILY INVOLVEMENT: This treatment plan has been presented to and reviewed with the patient, Peter Fuentes.  The patient and family have been given the opportunity to ask questions and make suggestions.  Delos Haring, RN 07/09/2020, 12:08 AM

## 2020-07-09 NOTE — [facility] Group Notes (Signed)
LCSW Group Therapy Note  07/09/2020   10:00-11:00am   Type of Therapy and Topic:  Group Therapy: Anger Cues and Responses  Participation Level:  Minimal   Description of Group:   In this group, patients learned how to recognize the physical, cognitive, emotional, and behavioral responses they have to anger-provoking situations.  They identified a recent time they became angry and how they reacted.  They analyzed how their reaction was possibly beneficial and how it was possibly unhelpful.  The group discussed a variety of healthier coping skills that could help with such a situation in the future.  Focus was placed on how helpful it is to recognize the underlying emotions to our anger, because working on those can lead to a more permanent solution as well as our ability to focus on the important rather than the urgent.  Therapeutic Goals: Patients will remember their last incident of anger and how they felt emotionally and physically, what their thoughts were at the time, and how they behaved. Patients will identify how their behavior at that time worked for them, as well as how it worked against them. Patients will explore possible new behaviors to use in future anger situations. Patients will learn that anger itself is normal and cannot be eliminated, and that healthier reactions can assist with resolving conflict rather than worsening situations.  Summary of Patient Progress:  The patient shared that he is "never angry."  When pressed, he said the last time he was "upset" was a month ago when his family told him they are moving to [place].  He stated that he then started "figuring out a plan."  His affect remained flat and he was unengaged during group with minimal eye contact.  He only spoke when asked direct questions, and even then his responses were short and lacked details.  When asked directly, he did say that his feelings of being upset are directly related to him being in the hospital  right now.  Therapeutic Modalities:   Cognitive Behavioral Therapy  Lynnell Chad

## 2020-07-09 NOTE — BHH Suicide Risk Assessment (Addendum)
[facility] Admission Suicide Risk Assessment   Nursing information obtained from:  Patient Demographic factors:  Male,Caucasian Current Mental Status: suicide attempt prior to admission Loss Factors:  Financial problems / change in socioeconomic status; loss of housing Historical Factors:  Alcohol use prior to admission Risk Reduction Factors:  Employed  Total Time Spent in Direct Patient Care:  I personally spent 45 minutes on the unit in direct patient care. The direct patient care time included face-to-face time with the patient, reviewing the patient's chart, communicating with other professionals, and coordinating care. Greater than 50% of this time was spent in counseling or coordinating care with the patient regarding goals of hospitalization, psycho-education, and discharge planning needs.  Principal Problem: MDD (major depressive disorder), single episode, severe (HCC) Diagnosis:  Principal Problem:   MDD (major depressive disorder), single episode, severe (HCC) Active Problems:   Suicidal behavior with attempted self-injury (HCC)  Subjective Data: The patient is a 60y/o male with no known past psychiatric history, who was admitted under IVC from [facility] after a suicide attempt by cutting his bilateral wrists after finding out his mother and stepfather are relocating to another state. He was taken to the OR by plastic surgery and was noted to have no tendon or nerve injury in his right wrist and 2 wrist flexors that were partially transected in the left wrist. He was medically cleared and transferred to [facility] for continued inpatient management. He reports that he moved from [place] 2 years ago to help take care of his mother and stepfather and just found out earlier this month that his parents were accepted off a waiting list for an assisted living in [place] with plans to move next Tuesday. His mother informed him of her plans to sell the family home and this triggered him to feel hopeless due  to housing situation. He states the suicide attempt was impulsive in the context of feeling he had "no options." He states he cannot afford an apartment or extended stay hotel and is not sure where he will live once his parents move. He denies current SI, intent or plan and denies AVH, paranoia, or delusions. He denies h/o mania or hypomania. He denies prior suicide attempts. His BAL in the ED was 210 and he admitted to drinking 2-3 cans of beer about 3 days/week. He denies current signs of withdrawal or cravings. He denied other recent illicit drug use. Leading up to the attempt, he admits that for the last month since finding out the news that his parents are relocating, he has felt sad, hopeless, ruminative about his housing situation, and has had fair appetite and fair focus. He endorses anhedonia, sense of worry, and feeling as if things are more of an effort. He has been pushing himself to go to work but has been "going through the motions." He reports fair sleep and energy. See H&P for additional details.   Continued Clinical Symptoms:  Alcohol Use Disorder Identification Test Final Score (AUDIT): 4 The "Alcohol Use Disorders Identification Test", Guidelines for Use in Primary Care, Second Edition.  World Science writer [facility]). Score between 0-7:  no or low risk or alcohol related problems. Score between 8-15:  moderate risk of alcohol related problems. Score between 16-19:  high risk of alcohol related problems. Score 20 or above:  warrants further diagnostic evaluation for alcohol dependence and treatment.  CLINICAL FACTORS:   Depression:   Impulsivity Alcohol/Substance Abuse/Dependencies  Musculoskeletal: Strength & Muscle Tone: untested in UE due to splinting/sutures Gait &  Station: untested in bed Patient leans: N/A  Psychiatric Specialty Exam: Physical Exam Vitals reviewed.  HENT:     Head: Normocephalic.  Pulmonary:     Effort: Pulmonary effort is normal.  Musculoskeletal:      Comments: Left and right ace wraps in place and left wrist splinted  Neurological:     General: No focal deficit present.     Mental Status: He is alert.     Review of Systems  Respiratory: Negative for shortness of breath.   Cardiovascular: Negative for chest pain and palpitations.  Neurological: Negative for dizziness, syncope and light-headedness.     Blood pressure (!) 82/69, pulse (!) 115, temperature 98.2 F (36.8 C), temperature source Oral, resp. rate 18, height 6' 0.25" (1.835 m), weight 68.5 kg, SpO2 99 %.Body mass index is 20.34 kg/m.  General Appearance: disheveled, in hospital scrubs with bilateral arm dressings in place  Eye Contact:  Fair  Speech:  Clear and Coherent and Normal Rate  Volume:  Normal  Mood:  Dysphoric and Irritable  Affect:  Congruent  Thought Process:  Goal Directed and Linear  Orientation:  Full (Time, Place, and Person)  Thought Content:  Logical and no evidence of acute psychosis, paranoia, or delusions  Suicidal Thoughts:  suicide attempt prior to admission; denies current SI, intent or plan  Homicidal Thoughts:  No  Memory:  Recent;   Fair  Judgement:  Fair  Insight:  Fair  Psychomotor Activity:  Normal  Concentration:  Concentration: Fair and Attention Span: Fair  Recall:  [employer] of Knowledge:  Fair  Language:  Good  Akathisia:  Negative  Assets:  Communication Skills Desire for Improvement Resilience  ADL's:  Intact  Cognition:  WNL  Sleep:  Number of Hours: 4.75   COGNITIVE FEATURES THAT CONTRIBUTE TO RISK:  Closed-mindedness    SUICIDE RISK:   Severe  PLAN OF CARE: Patient admitted under IVC and 2nd opinion completed. He is on a CIWA protocol to monitor for potential alcohol withdrawal with Ativan 1mg  for CIWA >10 and oral thiamine and folate replacement. Admission labs reviewed: SARS coronavirus negative, UDS positive for opiates, respiratory panel negative, CK 90, WBC 4.2, H/H 16.2/46.5 platelets 132, with chronically  elevated MCV of 102 and MCH 35.5 when compared to old labs 1 year ago, Tylenol <10, Salicylate <7, ETOH 210, CMP WNL except for CO2 21, AST 97, ALT 104 and total bili 1.3. 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 Patient is currently asymptomatic. I contacted oncall cardiologist, Dr. , on 07/09/20 who reviewed the EKG and felt it was likely lead placement and EKG was not concerning at this time for acute issues. Will check TSH and check hepatic function panel, check acute hepatitis panel and recheck CBC for platelet trending. R/b/se/a to medications discussed with patient and he is willing to start Remeron 15mg  to help with sleep, anxiety, appetite, and mood. Verified with pharmacy he received a Td booster prior to admission. Pushing po fluids and rechecking manual HR and BP. I called and spoke to Dr. 07/11/20 with plastic surgery on 07/09/20 who did his laceration repairs. He advises that the patient is to keep left splint in place and dry (bagged while showering) until follow up to protect extension of wrist and sutures are dissolvable. His right wrist wound can be wet with showering and dry dressing with neosporin. He is to f/u  with plastics in 2-3 weeks.   I certify that inpatient services furnished can reasonably be expected to improve the patient's condition.   Comer Locket, MD, FAPA 07/09/2020, 9:57 AM

## 2020-07-10 DIAGNOSIS — F322 Major depressive disorder, single episode, severe without psychotic features: Secondary | ICD-10-CM | POA: Diagnosis not present

## 2020-07-10 MED ORDER — PANTOPRAZOLE SODIUM 40 MG PO TBEC
40.0000 mg | DELAYED_RELEASE_TABLET | Freq: Every day | ORAL | Status: DC
  Administered 2020-07-10 – 2020-07-15 (×6): 40 mg via ORAL
  Filled 2020-07-10 (×7): qty 1
  Filled 2020-07-10: qty 7

## 2020-07-10 MED ORDER — IBUPROFEN 600 MG PO TABS
600.0000 mg | ORAL_TABLET | Freq: Three times a day (TID) | ORAL | Status: DC | PRN
  Administered 2020-07-11 – 2020-07-13 (×4): 600 mg via ORAL
  Filled 2020-07-10 (×4): qty 1

## 2020-07-10 NOTE — BHH Counselor (Signed)
Late Entry for 07/09/2020  Clinical Social Work Note  CSW made 2 attempts to rouse patient from his bed to do Psychosocial Assessment on 07/09/2020, but he would not open his eyes even though he did move.  Ambrose Mantle, LCSW 07/10/2020, 8:31 AM

## 2020-07-10 NOTE — Progress Notes (Signed)
[facility]  07/10/2020 6:42 AM Peter Fuentes  MRN:  [id number]   Chief Complaint: suicide attempt  Subjective:  Peter Fuentes is a 60 y.o. male with no known past psychiatric history, who was initially admitted for inpatient psychiatric hospitalization on 07/08/2020 for management of worsening depression after a suicide attempt by cutting his wrists after finding out his mother and stepfather are relocating to another state. In the ED on admission he had a BAL of 210.The patient is currently on Hospital Day 2.   Chart Review from last 24 hours:  The patient's chart was reviewed and nursing notes were reviewed. The patient's case was discussed in multidisciplinary team meeting. Per nursing he attended social work group and no behavioral issues or safety concerns were noted. Per MAR, he was compliant with scheduled medications except for refusal of thiamine and MVI. He received Vistaril X1 for anxiety but did not require Ativan per CIWA protocol.  Information Obtained Today During Patient Interview: The patient was seen and evaluated on the unit. On assessment today the patient reports that he has not yet showered, but he denies wound pain. He was advised that nursing will assist with getting his splint covered for a shower and for dressing changes. He reports good sleep and stable appetite. He has not had contact with his family and states he has not thought further about options for housing after discharge. He denies side-effects with start of Remeron and states his mood is "fine" today. He denies feeling depressed, anxious, or suicidal. He denies HI, AVH, paranoia, or delusions. He voices no other physical complaints other than some intermittent dizziness and denies current signs of alcohol withdrawal or cravings. He was encouraged to attend groups and drink more fluids.   Principal Problem: MDD (major depressive disorder), single episode, severe (HCC) Diagnosis: Principal Problem:   MDD  (major depressive disorder), single episode, severe (HCC) Active Problems:   Suicidal behavior with attempted self-injury (HCC)  Total Time Spent in Direct Patient Care:  I personally spent 30 minutes on the unit in direct patient care. The direct patient care time included face-to-face time with the patient, reviewing the patient's chart, communicating with other professionals, and coordinating care. Greater than 50% of this time was spent in counseling or coordinating care with the patient regarding goals of hospitalization, psycho-education, and discharge planning needs.  Past Psychiatric History: see admission H&P  Past Medical History:  Past Medical History:  Diagnosis Date  . COVID-19 01/2018  . Depression     Past Surgical History:  Procedure Laterality Date  . I & D EXTREMITY Bilateral 07/06/2020   Procedure: IRRIGATION AND DEBRIDEMENT OF RIGHT WRIST WITH COMPLEX CLOSURE , IRRIGATION AND DEBRIDEMENT OF LEFT WRIST WITH MEDIAN NERVE REPAIR, TENDON REPAIR X2, COMPLEX CLOSURE;  Surgeon: Allena Napoleon, MD;  Location: [facility] OR;  Service: Plastics;  Laterality: Bilateral;   Family History: see admission H&P  Family Psychiatric  History: see admission H&P  Social History:  Social History   Substance and Sexual Activity  Alcohol Use Yes   Comment: 8     Social History   Substance and Sexual Activity  Drug Use Yes  . Types: Oxycodone    Social History   Socioeconomic History  . Marital status: Unknown    Spouse name: Not on file  . Number of children: Not on file  . Years of education: Not on file  . Highest education level: Not on file  Occupational History  . Not on  file  Tobacco Use  . Smoking status: Current Every Day Smoker    Packs/day: 0.50    Years: 30.00    Pack years: 15.00  . Smokeless tobacco: Never Used  Vaping Use  . Vaping Use: Never used  Substance and Sexual Activity  . Alcohol use: Yes    Comment: 8  . Drug use: Yes    Types: Oxycodone  .  Sexual activity: Not on file  Other Topics Concern  . Not on file  Social History Narrative  . Not on file   Social Determinants of Health   Financial Resource Strain: Not on file  Food Insecurity: Not on file  Transportation Needs: Not on file  Physical Activity: Not on file  Stress: Not on file  Social Connections: Not on file   Sleep: Good  Appetite:  Good  Current Medications: Current Facility-Administered Medications  Medication Dose Route Frequency Provider Last Rate Last Admin  . acetaminophen (TYLENOL) tablet 650 mg  650 mg Oral Q6H PRN Lenard Lance, FNP      . alum & mag hydroxide-simeth (MAALOX/MYLANTA) 200-200-20 MG/5ML suspension 30 mL  30 mL Oral Q4H PRN Lenard Lance, FNP      . hydrOXYzine (ATARAX/VISTARIL) tablet 25 mg  25 mg Oral Q6H PRN Comer Locket, MD   25 mg at 07/09/20 2121  . loperamide (IMODIUM) capsule 2-4 mg  2-4 mg Oral PRN Mason Jim, Chabely Norby E, MD      . LORazepam (ATIVAN) tablet 1 mg  1 mg Oral Q6H PRN Mason Jim, Carolanne Mercier E, MD      . magnesium hydroxide (MILK OF MAGNESIA) suspension 30 mL  30 mL Oral Daily PRN Lenard Lance, FNP      . mirtazapine (REMERON) tablet 15 mg  15 mg Oral QHS Comer Locket, MD   15 mg at 07/09/20 2121  . multivitamin with minerals tablet 1 tablet  1 tablet Oral Daily Mason Jim, Rayona Sardinha E, MD      . neomycin-bacitracin-polymyxin (NEOSPORIN) ointment packet   Topical Daily Mason Jim, Aerianna Losey E, MD      . ondansetron (ZOFRAN-ODT) disintegrating tablet 4 mg  4 mg Oral Q6H PRN Mason Jim, Hiroki Wint E, MD      . thiamine tablet 100 mg  100 mg Oral Daily Lenard Lance, FNP      . traZODone (DESYREL) tablet 50 mg  50 mg Oral QHS PRN Lenard Lance, FNP   50 mg at 07/08/20 2311    Lab Results:  Results for orders placed or performed during the hospital encounter of 07/08/20 (from the past 48 hour(s))  TSH     Status: None   Collection Time: 07/09/20  5:38 PM  Result Value Ref Range   TSH 1.472 0.350 - 4.500 uIU/mL    Comment: Performed by a 3rd  Generation assay with a functional sensitivity of <=0.01 uIU/mL. Performed at [facility], [address], [place], [place] [address]   Hepatic function panel     Status: Abnormal   Collection Time: 07/09/20  5:38 PM  Result Value Ref Range   Total Protein 7.0 6.5 - 8.1 g/dL   Albumin 3.9 3.5 - 5.0 g/dL   AST 74 (H) 15 - 41 U/L   ALT 76 (H) 0 - 44 U/L   Alkaline Phosphatase 62 38 - 126 U/L   Total Bilirubin 0.9 0.3 - 1.2 mg/dL   Bilirubin, Direct 0.2 0.0 - 0.2 mg/dL   Indirect Bilirubin 0.7 0.3 -  0.9 mg/dL    Comment: Performed at [facility], [address] [place], [place] [address]  CBC with Differential/Platelet     Status: Abnormal   Collection Time: 07/09/20  5:38 PM  Result Value Ref Range   WBC 4.5 4.0 - 10.5 K/uL   RBC 4.05 (L) 4.22 - 5.81 MIL/uL   Hemoglobin 14.2 13.0 - 17.0 g/dL   HCT 82.942.3 56.239.0 - 13.052.0 %   MCV 104.4 (H) 80.0 - 100.0 fL   MCH 35.1 (H) 26.0 - 34.0 pg   MCHC 33.6 30.0 - 36.0 g/dL   RDW 86.511.7 78.411.5 - 69.615.5 %   Platelets 122 (L) 150 - 400 K/uL    Comment: Immature Platelet Fraction may be clinically indicated, consider ordering this additional test [id number]    nRBC 0.0 0.0 - 0.2 %   Neutrophils Relative % 57 %   Neutro Abs 2.5 1.7 - 7.7 K/uL   Lymphocytes Relative 27 %   Lymphs Abs 1.2 0.7 - 4.0 K/uL   Monocytes Relative 14 %   Monocytes Absolute 0.6 0.1 - 1.0 K/uL   Eosinophils Relative 2 %   Eosinophils Absolute 0.1 0.0 - 0.5 K/uL   Basophils Relative 0 %   Basophils Absolute 0.0 0.0 - 0.1 K/uL   Immature Granulocytes 0 %   Abs Immature Granulocytes 0.02 0.00 - 0.07 K/uL    Comment: Performed at [facility], [address] [place], [place] [address]    Blood Alcohol level:  Lab Results  Component Value Date   ETH 210 (H) 07/06/2020   ETH 137 (H) 04/10/2019    Metabolic Disorder Labs: No results found for: HGBA1C, MPG No results found for: PROLACTIN No results found for: CHOL,  TRIG, HDL, CHOLHDL, VLDL, LDLCALC  Physical Findings: AIMS: Facial and Oral Movements Muscles of Facial Expression: None, normal Lips and Perioral Area: None, normal Jaw: None, normal Tongue: None, normal,Extremity Movements Upper (arms, wrists, hands, fingers): None, normal Lower (legs, knees, ankles, toes): None, normal, Trunk Movements Neck, shoulders, hips: None, normal, Overall Severity Severity of abnormal movements (highest score from questions above): None, normal Incapacitation due to abnormal movements: None, normal Patient's awareness of abnormal movements (rate only patient's report): No Awareness, Dental Status Current problems with teeth and/or dentures?: Yes Does patient usually wear dentures?: No  CIWA:  CIWA-Ar Total: 1 COWS:  COWS Total Score: 1  Musculoskeletal: Strength & Muscle Tone: within normal limits Gait & Station: normal, steady Patient leans: N/A  Psychiatric Specialty Exam: Physical Exam Vitals reviewed.  HENT:     Head: Normocephalic.  Pulmonary:     Effort: Pulmonary effort is normal.  Skin:    Comments: Left arm splint in place and clean and dry; Right wrist once dressing removed shows multiple linear lacerations that have been sutured - two small areas with bleeding along suture line that stop with wiping/direct pressure - wounds have no erythema or sign of infection.  Neurological:     Mental Status: He is alert.     Review of Systems  Respiratory: Negative for shortness of breath.   Cardiovascular: Negative for chest pain.  Gastrointestinal: Negative for constipation, diarrhea, nausea and vomiting.  Neurological: Positive for dizziness.    Blood pressure 108/79, pulse 66, temperature 98 F (36.7 C), temperature source Oral, resp. rate 18, height 6' 0.25" (1.835 m), weight 68.5 kg, SpO2 100 %.Body mass index is 20.34 kg/m.  General Appearance: disheveled appearing in hospital scrubs  Eye Contact:  Fair  Speech:  Clear and Coherent and  Normal Rate  Volume:  Normal  Mood:  Dysphoric  Affect:  Constricted  Thought Process:  Goal Directed and Linear  Orientation:  Full (Time, Place, and Person)  Thought Content:  Logical and no acute psychosis, paranoia, or delusions  Suicidal Thoughts:  No  Homicidal Thoughts:  No  Memory:  Recent;   Fair  Judgement:  Fair  Insight:  Fair  Psychomotor Activity:  Decreased  Concentration:  Concentration: Fair and Attention Span: Fair  Recall:  [employer] of Knowledge:  Fair  Language:  Good  Akathisia:  Negative  Assets:  Communication Skills Desire for Improvement Resilience  ADL's:  independent  Cognition:  WNL  Sleep:  Number of Hours: 6.75   Treatment Plan Summary: Diagnoses / Active Problems: 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 Safety and Monitoring:  -- Involuntary admission to inpatient psychiatric unit for safety, stabilization and treatment  -- Daily contact with patient to assess and evaluate symptoms and progress in treatment  -- Patient's case to be discussed in multi-disciplinary team meeting  -- Observation Level : q15 minute checks  -- Vital signs:  q12 hours  -- Precautions: suicide  2. Psychiatric Diagnoses and Treatment:   MDD single episode severe without psychotic features -- Continue Remeron 15mg  po qhs for sleep, mood, and appetite stimulation  -- Patient would benefit from outpatient psychotherapy after discharge  -- Encouraged patient to participate in unit milieu and in scheduled group therapies   -- TSH 1.472  -- Short Term Goals: Ability to identify changes in lifestyle to reduce recurrence of condition will improve, Ability to demonstrate self-control will improve and Ability to identify and develop effective coping behaviors will improve  -- Long Term Goals: Improvement in symptoms so as ready for discharge   R/o Alcohol use d/o  -- BAL on admission 210 and patient vague as to pattern of  alcohol use prior to admission  -- Counseled on the need to abstain from alcohol use after discharge and to consider SAIOP or residential rehab after discharge  -- Patient on CIWA protocol with Ativan 1mg  for CIWA >10 and MVI and thiamine replacement(recent CIWA scores 0,1,1)  -- Start Protonix 40mg  daily for GI protection  -- Short Term Goals: Ability to identify triggers associated with substance abuse/mental health issues will improve  -- Long Term Goals: Improvement in symptoms so as ready for discharge   3. Medical Issues Being Addressed:   Bilateral wrist lacerations   -- Per discussion with Dr. , patient is to keep left splint dry and in place until f/u in 2-3 weeks and place bag over splint for showering but may get right wound in shower, with neosporin and dry dressing daily  -- Verified Td booster given in ED   Elevated LFTS  -- 07/10/20: AST down to 74 from 97 and ALT 76 down from 104 with total bili now 0.9 down from 1.3 and remainder of hepatic function panel WNL  -- Acute hepatitis panel pending  -- Holding Tylenol and has PRN Motrin for pain   Thrombocytopenia  -- Platelets 122 down from 132 - will need outpatient f/u for trending after discharge  4. Discharge Planning:   -- Social work and case management to assist with discharge planning and identification of hospital follow-up needs prior to discharge  -- Estimated LOS: 3-5 days  -- Discharge Concerns: Need to establish a safety plan;  Medication compliance and effectiveness  -- Discharge Goals: Return home with outpatient referrals for mental health follow-up including medication management/psychotherapy  Comer Locket, MD, FAPA 07/10/2020, 6:42 AM

## 2020-07-10 NOTE — Progress Notes (Signed)
   07/10/20 2231  Psych Admission Type (Psych Patients Only)  Admission Status Involuntary  Psychosocial Assessment  Patient Complaints None  Eye Contact Brief  Facial Expression Flat  Affect Appropriate to circumstance  Speech Logical/coherent  Interaction Assertive  Motor Activity Slow  Appearance/Hygiene Disheveled  Behavior Characteristics Appropriate to situation  Mood Depressed;Pleasant  Thought Process  Coherency WDL  Content WDL  Delusions None reported or observed  Perception WDL  Hallucination None reported or observed  Judgment Impaired  Confusion None  Danger to Self  Current suicidal ideation? Denies  Danger to Others  Danger to Others None reported or observed

## 2020-07-10 NOTE — Plan of Care (Signed)
Nurse discussed anxiety, depression and coping skills with patient.  

## 2020-07-10 NOTE — B[facility] Counselor (Signed)
Adult Comprehensive Assessment  Patient ID: Peter Fuentes, male   DOB: 1960/10/16, 60 y.o.   MRN: [id number]  Information Source: Information source: Patient  Current Stressors:  Patient states their primary concerns and needs for treatment are:: Needed stitches and medical attention. 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 Family Relationships: Denies stressors Financial / Lack of resources (include bankruptcy): Always Housing / Lack of housing: Has become a stressor because mother and her husband are moving and patient was living with them. Physical health (include injuries & life threatening diseases): Denies stressors - has always had perfect health.  However, he did cut his wrist and needs surgery. Social relationships: Does not have any local relationships except co-workers. Substance abuse: Denies stressors Bereavement / Loss: Father died when he was 15yo, has dealt with it.  Living/Environment/Situation:  Living Arrangements: Parent,Non-relatives/Friends Living conditions (as described by patient or guardian): Good Who else lives in the home?: Mother and her husband How long has patient lived in current situation?: 2-1/2 years What is atmosphere in current home: Comfortable,Loving,Supportive  Family History:  Marital status: Divorced Divorced, when?: 1995 Does patient have children?: No  Childhood History:  By whom was/is the patient raised?: Both parents,Mother Additional childhood history information: Mother and father until patient's father died when he was 15yo.  Then it was just mother.  She remarried and her husband has never been considered a stepfather. Description of patient's relationship with caregiver when they were a child: Mother - beautiful relationship; Father - good  relationship before his death Patient's description of current relationship with people who raised him/her: Mother - fine relationship;  Father - deceased; Stepfather - great, "he loves me." How were you disciplined when you got in trouble as a child/adolescent?: Hit, nothing major Does patient have siblings?: Yes Number of Siblings: 2 Description of patient's current relationship with siblings: Older brother, younger sister -- good relationships. Did patient suffer any verbal/emotional/physical/sexual abuse as a child?: No Did patient suffer from severe childhood neglect?: No Has patient ever been sexually abused/assaulted/raped as an adolescent or adult?: No Was the patient ever a victim of a crime or a disaster?: No Witnessed domestic violence?: No Has patient been affected by domestic violence as an adult?: No  Education:  Highest grade of school patient has completed: 1 year college Currently a Consulting civil engineer?: No Learning disability?: No  Employment/Work Situation:   Employment situation: Employed Where is patient currently employed?: [employer] long has patient been employed?: 1 year + Patient's job has been impacted by current illness: No What is the longest time patient has a held a job?: 12 years 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 Has patient ever been in the [employer]?: No  Financial Resources:   Financial resources: Income from employment Does patient have a representative payee or guardian?: No  Alcohol/Substance Abuse:   What has been your use of drugs/alcohol within the last 12 months?: Social drinking, no drugs If attempted suicide, did drugs/alcohol play a role in this?: Yes Alcohol/Substance Abuse Treatment Hx: Past Tx, Inpatient If yes, describe treatment: Rehab 1 time to get out of problems with 2 DUIs Has alcohol/substance abuse ever caused legal problems?: Yes  Social Support System:   Patient's Community Support System: Good Describe  Community Support System: Family Type of faith/religion: Presbyterian How does patient's  faith help to cope with current illness?: "It helps."  Leisure/Recreation:   Do You Have Hobbies?: No  Strengths/Needs:   What is the patient's perception of their strengths?: Personality, ability to get along with anyone any day, always well-liked by co-workers Patient states they can use these personal strengths during their treatment to contribute to their recovery: "I don't know" Patient states these barriers may affect/interfere with their treatment: None Patient states these barriers may affect their return to the community: None Other important information patient would like considered in planning for their treatment: None  Discharge Plan:   Currently receiving community mental health services: No Patient states concerns and preferences for aftercare planning are: Can go to his primary care phyician (has only seen one time) at [facility], Dr. Peri Maris.  Is willing to go to therapy after he has figured out where he is going to live. Patient states they will know when they are safe and ready for discharge when: "Feel it, I suppose." Does patient have access to transportation?: Yes Does patient have financial barriers related to discharge medications?: Yes Patient description of barriers related to discharge medications: Currently not working and no insurance Will patient be returning to same living situation after discharge?: Yes  Summary/Recommendations:   Summary and Recommendations (to be completed by the evaluator): Patient is a 60yo male hospitalized with a suicide attempt by cutting his wrists.  Primary stressors include finding out that his mother and her husband were very suddenly accepted into an assisted living facility in [place] and will be leaving, thus he will not have a place to live.  His sister lives in the area where parents are going, and she has been influential  in this decision, so there is some conflict emotionally with her.  The patient has never had mental health treatment previously and is mostly concerned about where he will be able to live at discharge.  He will initially go home with parents, but then decisions have to be made quickly.  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  He states he can go to his (new) primary care physician Dr. Drema Pry for medication management if he finds it necessary to stay on the medicine.  He can go for therapy at a walk-in clinic once he gets settled, "if it is necessary."   He denies having substance abuse issues, drinks socially and is very against drugs.  Patient would benefit from crisis stabilization, group therapy, medication management, psychoeducation, peer interaction and discharge planning.  At discharge it is recommended that the patient adhere to the established aftercare plan.  Peter Chad. 07/10/2020

## 2020-07-10 NOTE — Progress Notes (Signed)
   07/10/20 2216  COVID-19 Daily Checkoff  Have you had a fever (temp > 37.80C/100F)  in the past 24 hours?  No  If you have had runny nose, nasal congestion, sneezing in the past 24 hours, has it worsened? No  COVID-19 EXPOSURE  Have you traveled outside the state in the past 14 days? No  Have you been in contact with someone with a confirmed diagnosis of COVID-19 or PUI in the past 14 days without wearing appropriate PPE? No  Have you been living in the same home as a person with confirmed diagnosis of COVID-19 or a PUI (household contact)? No  Have you been diagnosed with COVID-19? No

## 2020-07-10 NOTE — BHH Group Notes (Signed)
LCSW Group Therapy Note  07/10/2020       Type of Therapy and Topic:  Group Therapy: "My Mental Health"  Participation Level:  Minimal   Description of Group:   In this group, patients were asked four questions in order to generate discussion around the idea of mental illness and/or substance addiction being medical problems: In one sentence describe the current state of your mental health or substance use. How much do you feel similar to or different from others? Do you tend to identify with other people or compare yourself to them?  In a word or sentence, share what you desire your mental health or substance use to be moving forward.  Discussion was held that led to the conclusion that comparing ourselves to others is not healthy, but identifying with the elements of their issues that are similar to ours is helpful.    Therapeutic Goals: Patients will identify their feelings about their current mental health/substance use problems. Patients will describe how they feel similar to or different from others, and whether they tend to identify with or compare themselves to other people with the same issues. Patients will explore the differences in these concepts and how a change of mindset about mental health/substance use can help with reaching recovery goals. Patients will think about and share what their recovery goals are, in terms of mental health and/or substance use.  Summary of Patient Progress:  The patient shared that the state of his mental state has been "perfect" until 30 days ago, and now it is "uncertain."  Therapeutic Modalities:   Processing Motivational Interviewing Psychoeducation  Lynnell Chad, MSW, LCSW

## 2020-07-10 NOTE — Progress Notes (Signed)
The focus of this group is to help patients review their daily goal of treatment and discuss progress on daily workbooks. Pt did not attend the evening group. 

## 2020-07-10 NOTE — Progress Notes (Signed)
D:  Patient denied SI and HI, contracts for safety.  Denied A/V hallucinations.  Denied pain. A:  Medications administered per MD orders.  Emotional support dn encouragement given patient. R:  Safety maintained with 15 minute checks.

## 2020-07-10 NOTE — [facility] Group Notes (Signed)
Did not attend group 

## 2020-07-11 DIAGNOSIS — F322 Major depressive disorder, single episode, severe without psychotic features: Secondary | ICD-10-CM | POA: Diagnosis not present

## 2020-07-11 NOTE — B[facility] Counselor (Signed)
CSW spoke with pt's mother multiple times about needing somewhere for him to go at discharge and that it is possible that he discharges later this week. Mother informed CSW that she needed to force him to go to residential treatment and CSW informed mother that she could not legally do so.   Fredirick Lathe, LCSWA Clinicial Social Worker [employer]

## 2020-07-11 NOTE — Progress Notes (Signed)
[facility] MD Progress Note  07/11/2020 6:38 AM Peter Fuentes  MRN:  [id number]   Chief Complaint: suicide attempt  Subjective:  Peter Fuentes is a 60 y.o. male with no known past psychiatric history, who was initially admitted for inpatient psychiatric hospitalization on 07/08/2020 for management of worsening depression after a suicide attempt by cutting his wrists after finding out his mother and stepfather are relocating to another state. In the ED on admission he had a BAL of 210.The patient is currently on Hospital Day 3.   Chart Review from last 24 hours:  The patient's chart was reviewed and nursing notes were reviewed. The patient's case was discussed in multidisciplinary team meeting. Per nursing, he had no behavioral issues or acute safety concerns noted. He attended 1 group and was prompted and assisted by staff with showering. Per MAR, he was compliant with scheduled medications and did receive Vistaril X1 for anxiety.   Information Obtained Today During Patient Interview: The patient was seen and evaluated on the unit. He states he did well showering with bag over his splint and denies incisional pain. He describes his mood as "fine" and denies medication side-effects. He states he is eating well and sleeping well and voices no physical complaints. He states his dizziness has resolved and he is feeling more steady today. He denies cravings for alcohol or current signs of withdrawal. He is not interested in residential rehab, sober living, or SAIOP at this time and states he is still undecided where he will live after discharge. He denies SI, HI, AVH, or paranoia. He was encouraged to be out of be and go to group.   Principal Problem: MDD (major depressive disorder), single episode, severe (HCC) Diagnosis: Principal Problem:   MDD (major depressive disorder), single episode, severe (HCC) Active Problems:   Suicidal behavior with attempted self-injury (HCC)  Total Time Spent in Direct  Patient Care:  I personally spent 25 minutes on the unit in direct patient care. The direct patient care time included face-to-face time with the patient, reviewing the patient's chart, communicating with other professionals, and coordinating care. Greater than 50% of this time was spent in counseling or coordinating care with the patient regarding goals of hospitalization, psycho-education, and discharge planning needs.  Past Psychiatric History: see admission H&P  Past Medical History:  Past Medical History:  Diagnosis Date  . COVID-19 01/2018  . Depression     Past Surgical History:  Procedure Laterality Date  . I & D EXTREMITY Bilateral 07/06/2020   Procedure: IRRIGATION AND DEBRIDEMENT OF RIGHT WRIST WITH COMPLEX CLOSURE , IRRIGATION AND DEBRIDEMENT OF LEFT WRIST WITH MEDIAN NERVE REPAIR, TENDON REPAIR X2, COMPLEX CLOSURE;  Surgeon: Allena Napoleon, MD;  Location: [facility] OR;  Service: Plastics;  Laterality: Bilateral;   Family History: see admission H&P  Family Psychiatric  History: see admission H&P  Social History:  Social History   Substance and Sexual Activity  Alcohol Use Yes   Comment: 8     Social History   Substance and Sexual Activity  Drug Use Yes  . Types: Oxycodone    Social History   Socioeconomic History  . Marital status: Unknown    Spouse name: Not on file  . Number of children: Not on file  . Years of education: Not on file  . Highest education level: Not on file  Occupational History  . Not on file  Tobacco Use  . Smoking status: Current Every Day Smoker    Packs/day: 0.50  Years: 30.00    Pack years: 15.00  . Smokeless tobacco: Never Used  Vaping Use  . Vaping Use: Never used  Substance and Sexual Activity  . Alcohol use: Yes    Comment: 8  . Drug use: Yes    Types: Oxycodone  . Sexual activity: Not on file  Other Topics Concern  . Not on file  Social History Narrative  . Not on file   Social Determinants of Health   Financial  Resource Strain: Not on file  Food Insecurity: Not on file  Transportation Needs: Not on file  Physical Activity: Not on file  Stress: Not on file  Social Connections: Not on file   Sleep: Good  Appetite:  Good  Current Medications: Current Facility-Administered Medications  Medication Dose Route Frequency Provider Last Rate Last Admin  . alum & mag hydroxide-simeth (MAALOX/MYLANTA) 200-200-20 MG/5ML suspension 30 mL  30 mL Oral Q4H PRN Lenard LanceAllen, Tina L, FNP      . hydrOXYzine (ATARAX/VISTARIL) tablet 25 mg  25 mg Oral Q6H PRN Comer LocketSingleton, Brandye Inthavong E, MD   25 mg at 07/10/20 2212  . ibuprofen (ADVIL) tablet 600 mg  600 mg Oral Q8H PRN Comer LocketSingleton, Leoma Folds E, MD      . loperamide (IMODIUM) capsule 2-4 mg  2-4 mg Oral PRN Mason JimSingleton, [place] Mielke E, MD      . LORazepam (ATIVAN) tablet 1 mg  1 mg Oral Q6H PRN Mason JimSingleton, Yao Hyppolite E, MD      . magnesium hydroxide (MILK OF MAGNESIA) suspension 30 mL  30 mL Oral Daily PRN Lenard LanceAllen, Tina L, FNP      . mirtazapine (REMERON) tablet 15 mg  15 mg Oral QHS Bartholomew CrewsSingleton, Bessy Reaney E, MD   15 mg at 07/10/20 2212  . multivitamin with minerals tablet 1 tablet  1 tablet Oral Daily Comer LocketSingleton, Abiola Behring E, MD   1 tablet at 07/10/20 0900  . neomycin-bacitracin-polymyxin (NEOSPORIN) ointment packet   Topical Daily Comer LocketSingleton, Bryli Mantey E, MD   Given at 07/10/20 0900  . ondansetron (ZOFRAN-ODT) disintegrating tablet 4 mg  4 mg Oral Q6H PRN Mason JimSingleton, Jonte Wollam E, MD      . pantoprazole (PROTONIX) EC tablet 40 mg  40 mg Oral Daily Mason JimSingleton, Lynessa Almanzar E, MD   40 mg at 07/10/20 0900  . thiamine tablet 100 mg  100 mg Oral Daily Doran HeaterAllen, Tina L, FNP   100 mg at 07/10/20 0900  . traZODone (DESYREL) tablet 50 mg  50 mg Oral QHS PRN Lenard LanceAllen, Tina L, FNP   50 mg at 07/08/20 2311    Lab Results:  Results for orders placed or performed during the hospital encounter of 07/08/20 (from the past 48 hour(s))  TSH     Status: None   Collection Time: 07/09/20  5:38 PM  Result Value Ref Range   TSH 1.472 0.350 - 4.500 uIU/mL    Comment:  Performed by a 3rd Generation assay with a functional sensitivity of <=0.01 uIU/mL. Performed at [facility], [address] [place], [place] [id number]   Hepatic function panel     Status: Abnormal   Collection Time: 07/09/20  5:38 PM  Result Value Ref Range   Total Protein 7.0 6.5 - 8.1 g/dL   Albumin 3.9 3.5 - 5.0 g/dL   AST 74 (H) 15 - 41 U/L   ALT 76 (H) 0 - 44 U/L   Alkaline Phosphatase 62 38 - 126 U/L   Total Bilirubin 0.9 0.3 - 1.2 mg/dL   Bilirubin, Direct  0.2 0.0 - 0.2 mg/dL   Indirect Bilirubin 0.7 0.3 - 0.9 mg/dL    Comment: Performed at [facility], [address] [place], [place] [address]  CBC with Differential/Platelet     Status: Abnormal   Collection Time: 07/09/20  5:38 PM  Result Value Ref Range   WBC 4.5 4.0 - 10.5 K/uL   RBC 4.05 (L) 4.22 - 5.81 MIL/uL   Hemoglobin 14.2 13.0 - 17.0 g/dL   HCT 32.9 92.4 - 26.8 %   MCV 104.4 (H) 80.0 - 100.0 fL   MCH 35.1 (H) 26.0 - 34.0 pg   MCHC 33.6 30.0 - 36.0 g/dL   RDW 34.1 96.2 - 22.9 %   Platelets 122 (L) 150 - 400 K/uL    Comment: Immature Platelet Fraction may be clinically indicated, consider ordering this additional test [id number]    nRBC 0.0 0.0 - 0.2 %   Neutrophils Relative % 57 %   Neutro Abs 2.5 1.7 - 7.7 K/uL   Lymphocytes Relative 27 %   Lymphs Abs 1.2 0.7 - 4.0 K/uL   Monocytes Relative 14 %   Monocytes Absolute 0.6 0.1 - 1.0 K/uL   Eosinophils Relative 2 %   Eosinophils Absolute 0.1 0.0 - 0.5 K/uL   Basophils Relative 0 %   Basophils Absolute 0.0 0.0 - 0.1 K/uL   Immature Granulocytes 0 %   Abs Immature Granulocytes 0.02 0.00 - 0.07 K/uL    Comment: Performed at [facility], [address], [place], [place] [address]    Blood Alcohol level:  Lab Results  Component Value Date   ETH 210 (H) 07/06/2020   ETH 137 (H) 04/10/2019    Metabolic Disorder Labs: No results found for: HGBA1C, MPG No results found for: PROLACTIN No results  found for: CHOL, TRIG, HDL, CHOLHDL, VLDL, LDLCALC  Physical Findings: AIMS: Facial and Oral Movements Muscles of Facial Expression: None, normal Lips and Perioral Area: None, normal Jaw: None, normal Tongue: None, normal,Extremity Movements Upper (arms, wrists, hands, fingers): None, normal Lower (legs, knees, ankles, toes): None, normal, Trunk Movements Neck, shoulders, hips: None, normal, Overall Severity Severity of abnormal movements (highest score from questions above): None, normal Incapacitation due to abnormal movements: None, normal Patient's awareness of abnormal movements (rate only patient's report): No Awareness, Dental Status Current problems with teeth and/or dentures?: Yes Does patient usually wear dentures?: No  CIWA:  CIWA-Ar Total: 1 COWS:  COWS Total Score: 1  Musculoskeletal: Strength & Muscle Tone: within normal limits Gait & Station: normal, steady Patient leans: N/A  Psychiatric Specialty Exam: Physical Exam Vitals reviewed.  HENT:     Head: Normocephalic.  Pulmonary:     Effort: Pulmonary effort is normal.  Skin:    Comments: Left arm splint in place and clean and dry; Right wrist once dressing clean and dry  Neurological:     Mental Status: He is alert.     Review of Systems  Respiratory: Negative for shortness of breath.   Cardiovascular: Negative for chest pain.  Gastrointestinal: Negative for constipation, diarrhea, nausea and vomiting.  Neurological: Negative for dizziness.    Blood pressure 115/78, pulse 78, temperature 98 F (36.7 C), temperature source Oral, resp. rate 18, height 6' 0.25" (1.835 m), weight 68.5 kg, SpO2 100 %.Body mass index is 20.34 kg/m.  General Appearance: improved hygiene; in hospital scrubs  Eye Contact:  good  Speech:  Clear and Coherent and Normal Rate  Volume:  Normal  Mood:  Described as "fine" - appears mildly irritable  Affect:  Constricted  Thought Process:  Goal Directed and Linear  Orientation:   Full (Time, Place, and Person)  Thought Content:  Logical and no acute psychosis, paranoia, or delusions  Suicidal Thoughts:  No  Homicidal Thoughts:  No  Memory:  Recent;   Fair  Judgement:  Fair  Insight:  Fair  Psychomotor Activity:  Normal  Concentration:  Improved  Recall:  [employer] of Knowledge:  Fair  Language:  Good  Akathisia:  Negative  Assets:  Communication Skills Desire for Improvement Resilience  ADL's:  independent  Cognition:  WNL  Sleep:  Number of Hours: 6.5   Treatment Plan Summary: Diagnoses / Active Problems: 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 Safety and Monitoring:  -- Involuntary admission to inpatient psychiatric unit for safety, stabilization and treatment  -- Daily contact with patient to assess and evaluate symptoms and progress in treatment  -- Patient's case to be discussed in multi-disciplinary team meeting  -- Observation Level : q15 minute checks  -- Vital signs:  q12 hours  -- Precautions: suicide  2. Psychiatric Diagnoses and Treatment:   MDD single episode severe without psychotic features -- Continue Remeron 15mg  po qhs for sleep, mood, and appetite stimulation  -- Patient would benefit from outpatient psychotherapy after discharge  -- Encouraged patient to participate in unit milieu and in scheduled group therapies   -- Short Term Goals: Ability to identify changes in lifestyle to reduce recurrence of condition will improve, Ability to demonstrate self-control will improve and Ability to identify and develop effective coping behaviors will improve  -- Long Term Goals: Improvement in symptoms so as ready for discharge   R/o Alcohol use d/o  -- BAL on admission 210 and patient vague as to pattern of alcohol use prior to admission  -- Counseled on the need to abstain from alcohol use after discharge and to consider SAIOP or residential rehab after discharge  -- Patient on CIWA protocol  with Ativan 1mg  for CIWA >10 and MVI and thiamine replacement(recent CIWA scores 1,1,1,2,1)  -- Continue Protonix 40mg  daily for GI protection  -- Short Term Goals: Ability to identify triggers associated with substance abuse/mental health issues will improve  -- Long Term Goals: Improvement in symptoms so as ready for discharge   3. Medical Issues Being Addressed:   Bilateral wrist lacerations (Left median nerve and 2 tendon repair)  -- Per discussion with Dr. , patient is to keep left splint dry and in place until f/u in 2-3 weeks and place bag over splint for showering but may get right wound in shower, with neosporin and dry dressing daily  -- Verified Td booster given in ED   Elevated LFTS  -- 07/10/20: AST down to 74 from 97 and ALT 76 down from 104 with total bili now 0.9 down from 1.3 and remainder of hepatic function panel WNL  -- Acute hepatitis panel pending  -- Holding Tylenol and has PRN Motrin for pain   Thrombocytopenia  -- Platelets 122 down from 132 - will need outpatient f/u for trending after discharge  4. Discharge Planning:   -- Social work and case management to assist with discharge planning and identification of hospital follow-up needs prior to discharge - still need housing and safety planning confirmed  -- Estimated LOS: 2-3 days  -- Discharge Concerns: Need to establish a safety plan; Medication compliance and effectiveness  --  Discharge Goals: Return home with outpatient referrals for mental health follow-up including medication management/psychotherapy  Comer Locket, MD, FAPA 07/11/2020, 6:38 AM

## 2020-07-11 NOTE — Progress Notes (Signed)
Did not attend. 

## 2020-07-11 NOTE — Progress Notes (Signed)
[facility] Group Notes:  (Nursing/MHT/Case Management/Adjunct)  Date:  07/11/2020  Time:  8:09 PM  Type of Therapy:  Group Therapy  Participation Level:  Did Not Attend  Participation Quality: NA  Affect:  NA Cognitive:  NA  Insight: NA   Engagement in Group: NA  Modes of Intervention:  NA  Summary of Progress/Problems:  Peter Fuentes Peter Fuentes 07/11/2020, 8:09 PM 

## 2020-07-11 NOTE — Progress Notes (Signed)
Recreation Therapy Notes  Date:  5.30.22 Time: 0930 Location: [address] Dayroom  Group Topic: Stress Management  Goal Area(s) Addresses:  Patient will identify positive stress management techniques. Patient will identify benefits of using stress management post d/c.  Intervention: Stress Management  Activity :  Meditation.  LRT played a meditation that focused on being persistent in not only meditation but in life as well.  Patients were to listen and follow along as meditation played to fully engage in activity.  Education:  Stress Management, Discharge Planning.   Education Outcome: Acknowledges Education  Clinical Observations/Feedback: Pt did not attend group session.    Caroll Rancher, LRT/CTRS         Caroll Rancher A 07/11/2020 12:05 PM

## 2020-07-11 NOTE — Progress Notes (Signed)
Dressing change to right wrist, no signs of infection observed, small amount of serosanguinous drainage on dressing. Pt calm and cooperative.

## 2020-07-11 NOTE — Tx Team (Signed)
Interdisciplinary Treatment and Diagnostic Plan Update  07/11/2020 Time of Session: 9:20am Peter Fuentes MRN: [id number]  Principal Diagnosis: MDD (major depressive disorder), single episode, severe ([place])  Secondary Diagnoses: Principal Problem:   MDD (major depressive disorder), single episode, severe ([place]) Active Problems:   Suicidal behavior with attempted self-injury ([place])   Current Medications:  Current Facility-Administered Medications  Medication Dose Route Frequency Provider Last Rate Last Admin  . alum & mag hydroxide-simeth (MAALOX/MYLANTA) 200-200-20 MG/5ML suspension 30 mL  30 mL Oral Q4H PRN Lucky Rathke, FNP      . hydrOXYzine (ATARAX/VISTARIL) tablet 25 mg  25 mg Oral Q6H PRN Viann Fish E, MD   25 mg at 07/10/20 2212  . ibuprofen (ADVIL) tablet 600 mg  600 mg Oral Q8H PRN Harlow Asa, MD   600 mg at 07/11/20 3419  . loperamide (IMODIUM) capsule 2-4 mg  2-4 mg Oral PRN Nelda Marseille, Amy E, MD      . LORazepam (ATIVAN) tablet 1 mg  1 mg Oral Q6H PRN Nelda Marseille, Amy E, MD      . magnesium hydroxide (MILK OF MAGNESIA) suspension 30 mL  30 mL Oral Daily PRN Lucky Rathke, FNP      . mirtazapine (REMERON) tablet 15 mg  15 mg Oral QHS Viann Fish E, MD   15 mg at 07/10/20 2212  . multivitamin with minerals tablet 1 tablet  1 tablet Oral Daily Harlow Asa, MD   1 tablet at 07/11/20 0813  . neomycin-bacitracin-polymyxin (NEOSPORIN) ointment packet   Topical Daily Harlow Asa, MD   1 application at 62/22/97 [phone]  . ondansetron (ZOFRAN-ODT) disintegrating tablet 4 mg  4 mg Oral Q6H PRN Nelda Marseille, Amy E, MD      . pantoprazole (PROTONIX) EC tablet 40 mg  40 mg Oral Daily Nelda Marseille, Amy E, MD   40 mg at 07/11/20 0815  . thiamine tablet 100 mg  100 mg Oral Daily Lucky Rathke, FNP   100 mg at 07/11/20 1194  . traZODone (DESYREL) tablet 50 mg  50 mg Oral QHS PRN Lucky Rathke, FNP   50 mg at 07/08/20 2311   PTA Medications: No medications prior to admission.     Patient Stressors: Marital or family conflict Medication change or noncompliance  Patient Strengths: Technical sales engineer for treatment/growth  Treatment Modalities: Medication Management, Group therapy, Case management,  1 to 1 session with clinician, Psychoeducation, Recreational therapy.   Physician Treatment Plan for Primary Diagnosis: MDD (major depressive disorder), single episode, severe ([place]) Long Term Goal(s): Improvement in symptoms so as ready for discharge Improvement in symptoms so as ready for discharge   Short Term Goals: Ability to identify changes in lifestyle to reduce recurrence of condition will improve Ability to demonstrate self-control will improve Ability to identify and develop effective coping behaviors will improve Ability to identify triggers associated with substance abuse/mental health issues will improve  Medication Management: Evaluate patient's response, side effects, and tolerance of medication regimen.  Therapeutic Interventions: 1 to 1 sessions, Unit Group sessions and Medication administration.  Evaluation of Outcomes: Not Met  Physician Treatment Plan for Secondary Diagnosis: Principal Problem:   MDD (major depressive disorder), single episode, severe ([place]) Active Problems:   Suicidal behavior with attempted self-injury ([place])  Long Term Goal(s): Improvement in symptoms so as ready for discharge Improvement in symptoms so as ready for discharge   Short Term Goals: Ability to identify changes in lifestyle to reduce recurrence of condition will  improve Ability to demonstrate self-control will improve Ability to identify and develop effective coping behaviors will improve Ability to identify triggers associated with substance abuse/mental health issues will improve     Medication Management: Evaluate patient's response, side effects, and tolerance of medication regimen.  Therapeutic Interventions: 1 to 1 sessions, Unit  Group sessions and Medication administration.  Evaluation of Outcomes: Not Met   RN Treatment Plan for Primary Diagnosis: MDD (major depressive disorder), single episode, severe ([place]) Long Term Goal(s): Knowledge of disease and therapeutic regimen to maintain health will improve  Short Term Goals: Ability to remain free from injury will improve, Ability to verbalize frustration and anger appropriately will improve, Ability to identify and develop effective coping behaviors will improve and Compliance with prescribed medications will improve  Medication Management: RN will administer medications as ordered by provider, will assess and evaluate patient's response and provide education to patient for prescribed medication. RN will report any adverse and/or side effects to prescribing provider.  Therapeutic Interventions: 1 on 1 counseling sessions, Psychoeducation, Medication administration, Evaluate responses to treatment, Monitor vital signs and CBGs as ordered, Perform/monitor CIWA, COWS, AIMS and Fall Risk screenings as ordered, Perform wound care treatments as ordered.  Evaluation of Outcomes: Not Met   LCSW Treatment Plan for Primary Diagnosis: MDD (major depressive disorder), single episode, severe ([place]) Long Term Goal(s): Safe transition to appropriate next level of care at discharge, Engage patient in therapeutic group addressing interpersonal concerns.  Short Term Goals: Engage patient in aftercare planning with referrals and resources, Increase social support, Increase ability to appropriately verbalize feelings, Increase emotional regulation, Identify triggers associated with mental health/substance abuse issues and Increase skills for wellness and recovery  Therapeutic Interventions: Assess for all discharge needs, 1 to 1 time with Social worker, Explore available resources and support systems, Assess for adequacy in community support network, Educate family and significant other(s)  on suicide prevention, Complete Psychosocial Assessment, Interpersonal group therapy.  Evaluation of Outcomes: Not Met   Progress in Treatment: Attending groups: Yes. Participating in groups: Yes. and No. Taking medication as prescribed: Yes. Toleration medication: Yes. Family/Significant other contact made: No, will contact:  mother Patient understands diagnosis: Yes. Discussing patient identified problems/goals with staff: Yes. Medical problems stabilized or resolved: Yes. Denies suicidal/homicidal ideation: Yes. Issues/concerns per patient self-inventory: No.   New problem(s) identified: No, Describe:  none  New Short Term/Long Term Goal(s): detox, medication management for mood stabilization; elimination of SI thoughts; development of comprehensive mental wellness/sobriety plan  Patient Goals:  Did not attend  Discharge Plan or Barriers: Patient recently admitted. CSW will continue to follow and assess for appropriate referrals and possible discharge planning.    Reason for Continuation of Hospitalization: Depression Medication stabilization Suicidal ideation  Estimated Length of Stay: 3-5 days  Attendees: Patient: Did not attend 07/11/2020   Physician:  07/11/2020   Nursing:  07/11/2020   RN Care Manager: 07/11/2020   Social Worker: Darletta Moll, LCSW 07/11/2020   Recreational Therapist:  07/11/2020   Other:  07/11/2020   Other:  07/11/2020  Other: 07/11/2020     Scribe for Treatment Team: Vassie Moselle, LCSW 07/11/2020 9:54 AM

## 2020-07-11 NOTE — [facility] Counselor (Signed)
CSW spoke to pt and asked him where he will live when he discharges from [facility]. Pt states, "I have no idea. That's the problem.". CSW explained that she could provide shelter resources. Pt cut off CSW and stated, "I WILL NOT LIVE IN A SHELTER.". CSW stated that this may be his only option at this time and encouraged him to reach out to family and friends for alternate placement.   Fredirick Lathe, LCSWA Clinicial Social Worker [employer]

## 2020-07-11 NOTE — Progress Notes (Signed)
    07/11/20 0800  Psych Admission Type (Psych Patients Only)  Admission Status Involuntary  Psychosocial Assessment  Patient Complaints None  Eye Contact Brief  Facial Expression Flat  Affect Appropriate to circumstance  Speech Logical/coherent  Interaction Assertive  Motor Activity Slow  Appearance/Hygiene Disheveled  Behavior Characteristics Cooperative;Appropriate to situation  Mood Depressed;Pleasant  Thought Process  Coherency WDL  Content WDL  Delusions None reported or observed  Perception WDL  Hallucination None reported or observed  Judgment Impaired  Confusion None  Danger to Self  Current suicidal ideation? Denies  Danger to Others  Danger to Others None reported or observed  [place] NOVEL CORONAVIRUS (COVID-19) DAILY CHECK-OFF SYMPTOMS - answer yes or no to each - every day NO YES  Have you had a fever in the past 24 hours?  Fever (Temp > 37.80C / 100F) X   Have you had any of these symptoms in the past 24 hours? New Cough  Sore Throat   Shortness of Breath  Difficulty Breathing  Unexplained Body Aches   X   Have you had any one of these symptoms in the past 24 hours not related to allergies?   Runny Nose  Nasal Congestion  Sneezing   X   If you have had runny nose, nasal congestion, sneezing in the past 24 hours, has it worsened?  X   EXPOSURES - check yes or no X   Have you traveled outside the state in the past 14 days?  X   Have you been in contact with someone with a confirmed diagnosis of COVID-19 or PUI in the past 14 days without wearing appropriate PPE?  X   Have you been living in the same home as a person with confirmed diagnosis of COVID-19 or a PUI (household contact)?    X   Have you been diagnosed with COVID-19?    X              What to do next: Answered NO to all: Answered YES to anything:   Proceed with unit schedule Follow the [facility] Inpatient Flowsheet.

## 2020-07-11 NOTE — Progress Notes (Signed)
   07/11/20 2120  Psych Admission Type (Psych Patients Only)  Admission Status Involuntary  Psychosocial Assessment  Patient Complaints None  Eye Contact Brief  Facial Expression Flat  Affect Appropriate to circumstance  Speech Logical/coherent  Interaction Assertive  Motor Activity Slow  Appearance/Hygiene Disheveled  Behavior Characteristics Cooperative;Appropriate to situation  Mood Depressed;Pleasant  Thought Process  Coherency WDL  Content WDL  Delusions None reported or observed  Perception WDL  Hallucination None reported or observed  Judgment Impaired  Confusion None  Danger to Self  Current suicidal ideation? Denies  Danger to Others  Danger to Others None reported or observed

## 2020-07-11 NOTE — [facility] Suicide Risk Assessment (Signed)
[facility] INPATIENT:  Family/Significant Other Suicide Prevention Education  Suicide Prevention Education:  Education Completed; mother Doree Albee [phone],  (name of family member/significant other) has been identified by the patient as the family member/significant other with whom the patient will be residing, and identified as the person(s) who will aid the patient in the event of a mental health crisis (suicidal ideations/suicide attempt).  With written consent from the patient, the family member/significant other has been provided the following suicide prevention education, prior to the and/or following the discharge of the patient.  The suicide prevention education provided includes the following:  Suicide risk factors  Suicide prevention and interventions  National Suicide Hotline telephone number  [facility] assessment telephone number  [facility] Emergency Assistance 911  [facility] and/or Residential Mobile Crisis Unit telephone number  Request made of family/significant other to:  Remove weapons (e.g., guns, rifles, knives), all items previously/currently identified as safety concern.    Remove drugs/medications (over-the-counter, prescriptions, illicit drugs), all items previously/currently identified as a safety concern.  The family member/significant other verbalizes understanding of the suicide prevention education information provided.  The family member/significant other agrees to remove the items of safety concern listed above.  Ms. Sissy Hoff reports that her and her husband are moving on Wednesday and that the pt has been living with them. At discharge, she would like him to come to [place] where they are moving and family is located and be in a hospital "like this one" there. Ms. Sissy Hoff states that he would have to wait 30 days though so that he could get insurance to go there. CSW informed Ms. Sissy Hoff that we are a short-term stay and that  our patients are typically only here 3-5 days so he would not be able to stay here till then. CSW also explained that at discharge pt would not meet criteria for another inpatient hospitalization but would follow-up with outpatient services for therapy and medication management for continued support. CSW explained that it is not required for pt to have insurance for these services as there are facilities that take uninsured individuals. Ms. Sissy Hoff voiced that she was concerned about this because pt has no car. CSW explained that these appointments often can be virtual. CSW asked Ms. Sissy Hoff how pt would get to [place] and she stated that family would come to [facility] and fly or drive back with him. Ms. Sissy Hoff was upset that pt cannot stay for 30 days and was unsure of where he would live at discharge at this time. CSW explained that pt has been offered residential substance use treatment and shelter options for housing but has declined both. Ms. Sissy Hoff stated she understood and that she would call CSW back with more information on this situation.   Felizardo Hoffmann 07/11/2020, 1:29 PM

## 2020-07-12 DIAGNOSIS — F322 Major depressive disorder, single episode, severe without psychotic features: Secondary | ICD-10-CM | POA: Diagnosis not present

## 2020-07-12 MED ORDER — LOPERAMIDE HCL 2 MG PO CAPS
2.0000 mg | ORAL_CAPSULE | ORAL | Status: AC | PRN
Start: 2020-07-12 — End: 2020-07-15

## 2020-07-12 MED ORDER — ONDANSETRON 4 MG PO TBDP: 4.0000 mg | ORAL_TABLET | Freq: Four times a day (QID) | ORAL | Status: AC | PRN

## 2020-07-12 MED ORDER — HYDROXYZINE HCL 25 MG PO TABS
25.0000 mg | ORAL_TABLET | Freq: Four times a day (QID) | ORAL | Status: AC | PRN
  Administered 2020-07-12: 25 mg via ORAL
  Filled 2020-07-12: qty 10
  Filled 2020-07-12: qty 1

## 2020-07-12 NOTE — Progress Notes (Signed)
Psychoeducational Group Note  Date:  07/12/2020 Time: 2000  Group Topic/Focus:  wrap up group  Participation Level: Did Not Attend  Participation Quality:  Not Applicable  Affect:  Not Applicable  Cognitive:  Not Applicable  Insight:  Not Applicable  Engagement in Group: Not Applicable  Additional Comments:  Did not attend.   Marcille Buffy 07/12/2020, 8:47 PM

## 2020-07-12 NOTE — Progress Notes (Signed)
D: Patient presents with flat affect but is appropriate at time of assessment. Patient reports he feels no change in his mood with the medication. Patient denies SI/HI at this time. Patient also denies AH/VH at this time. Patient contracts for safety.  A: Provided positive reinforcement and encouragement.  R: Patient cooperative and receptive to efforts. Patient remains safe on the unit.   07/12/20 2124  Psych Admission Type (Psych Patients Only)  Admission Status Voluntary  Psychosocial Assessment  Patient Complaints None  Eye Contact Brief  Facial Expression Flat  Affect Appropriate to circumstance  Speech Logical/coherent  Interaction Assertive  Motor Activity Slow  Appearance/Hygiene In scrubs;Disheveled  Behavior Characteristics Cooperative;Appropriate to situation  Mood Depressed;Pleasant  Thought Process  Coherency WDL  Content WDL  Delusions None reported or observed  Perception WDL  Hallucination None reported or observed  Judgment Impaired  Confusion None  Danger to Self  Current suicidal ideation? Denies  Danger to Others  Danger to Others None reported or observed

## 2020-07-12 NOTE — [facility] Counselor (Signed)
CSW left a HIPPA compliant message for pt's mother Doree Albee [phone] to follow up about housing arrangements post discharge.   Fredirick Lathe, LCSWA Clinicial Social Worker [employer]

## 2020-07-12 NOTE — Progress Notes (Signed)
[facility] MD Progress Note  07/12/2020 11:54 AM Peter Fuentes  MRN:  [id number] Subjective: Patient is a 60 year old male with a past psychiatric history significant for depression, alcohol dependence who was transferred to the behavioral health hospital on 07/09/2020 after an attempt to kill himself by cutting his wrists bilaterally.  Objective: Patient is seen and examined.  Patient is a 60 year old male with the above-stated past psychiatric history who is seen in follow-up.  He stated he feels better today.  He stated he is not having any withdrawal symptoms.  He stated he thinks he is going to relocate to [place] when his family moves there.  He denied any suicidal ideation, he denied any psychotic symptoms.  He stated he has been in conversation with his mother about relocating to [place] when they go there.  He stated that his plan is that he was told that he would be here until Friday, and then be discharged on Friday to stay with his family.  He denied any problems with his wounds.  He stated that if he is here he will goes to the surgeons, but if not he stated his sister has "many doctors".  His pulse is 66, blood pressure is 117/94.  He slept 6.75 hours last night.  Review of his admission laboratories revealed a mildly elevated liver function enzymes.  On 5/28 his AST was 74 and his ALT was 76.  His MCV was elevated at 104.4.  Platelets were 122,000.  1 year ago they were 189,000, 6 days ago was 132,000.  Blood alcohol on 5/25 was 210.  TSH was normal at 1.472.  Drug screen was positive for opiates.  Principal Problem: MDD (major depressive disorder), single episode, severe (HCC) Diagnosis: Principal Problem:   MDD (major depressive disorder), single episode, severe (HCC) Active Problems:   Suicidal behavior with attempted self-injury (HCC)  Total Time spent with patient: 20 minutes  Past Psychiatric History: See admission H&P  Past Medical History:  Past Medical History:  Diagnosis Date   . COVID-19 01/2018  . Depression     Past Surgical History:  Procedure Laterality Date  . I & D EXTREMITY Bilateral 07/06/2020   Procedure: IRRIGATION AND DEBRIDEMENT OF RIGHT WRIST WITH COMPLEX CLOSURE , IRRIGATION AND DEBRIDEMENT OF LEFT WRIST WITH MEDIAN NERVE REPAIR, TENDON REPAIR X2, COMPLEX CLOSURE;  Surgeon: Allena Napoleon, MD;  Location: [facility] OR;  Service: Plastics;  Laterality: Bilateral;   Family History: History reviewed. No pertinent family history. Family Psychiatric  History: See admission H&P Social History:  Social History   Substance and Sexual Activity  Alcohol Use Yes   Comment: 8     Social History   Substance and Sexual Activity  Drug Use Yes  . Types: Oxycodone    Social History   Socioeconomic History  . Marital status: Unknown    Spouse name: Not on file  . Number of children: Not on file  . Years of education: Not on file  . Highest education level: Not on file  Occupational History  . Not on file  Tobacco Use  . Smoking status: Current Every Day Smoker    Packs/day: 0.50    Years: 30.00    Pack years: 15.00  . Smokeless tobacco: Never Used  Vaping Use  . Vaping Use: Never used  Substance and Sexual Activity  . Alcohol use: Yes    Comment: 8  . Drug use: Yes    Types: Oxycodone  . Sexual activity: Not on file  Other Topics Concern  . Not on file  Social History Narrative  . Not on file   Social Determinants of Health   Financial Resource Strain: Not on file  Food Insecurity: Not on file  Transportation Needs: Not on file  Physical Activity: Not on file  Stress: Not on file  Social Connections: Not on file   Additional Social History:                         Sleep: Good  Appetite:  Good  Current Medications: Current Facility-Administered Medications  Medication Dose Route Frequency Provider Last Rate Last Admin  . alum & mag hydroxide-simeth (MAALOX/MYLANTA) 200-200-20 MG/5ML suspension 30 mL  30 mL Oral Q4H PRN  Lenard Lance, FNP      . ibuprofen (ADVIL) tablet 600 mg  600 mg Oral Q8H PRN Comer Locket, MD   600 mg at 07/12/20 0113  . magnesium hydroxide (MILK OF MAGNESIA) suspension 30 mL  30 mL Oral Daily PRN Lenard Lance, FNP      . mirtazapine (REMERON) tablet 15 mg  15 mg Oral QHS Comer Locket, MD   15 mg at 07/11/20 2120  . multivitamin with minerals tablet 1 tablet  1 tablet Oral Daily Comer Locket, MD   1 tablet at 07/12/20 0910  . neomycin-bacitracin-polymyxin (NEOSPORIN) ointment packet   Topical Daily Comer Locket, MD   Given at 07/12/20 [phone]  . pantoprazole (PROTONIX) EC tablet 40 mg  40 mg Oral Daily Bartholomew Crews E, MD   40 mg at 07/12/20 0910  . thiamine tablet 100 mg  100 mg Oral Daily Lenard Lance, FNP   100 mg at 07/12/20 0911  . traZODone (DESYREL) tablet 50 mg  50 mg Oral QHS PRN Lenard Lance, FNP   50 mg at 07/11/20 2120    Lab Results: No results found for this or any previous visit (from the past 48 hour(s)).  Blood Alcohol level:  Lab Results  Component Value Date   ETH 210 (H) 07/06/2020   ETH 137 (H) 04/10/2019    Metabolic Disorder Labs: No results found for: HGBA1C, MPG No results found for: PROLACTIN No results found for: CHOL, TRIG, HDL, CHOLHDL, VLDL, LDLCALC  Physical Findings: AIMS: Facial and Oral Movements Muscles of Facial Expression: None, normal Lips and Perioral Area: None, normal Jaw: None, normal Tongue: None, normal,Extremity Movements Upper (arms, wrists, hands, fingers): None, normal Lower (legs, knees, ankles, toes): None, normal, Trunk Movements Neck, shoulders, hips: None, normal, Overall Severity Severity of abnormal movements (highest score from questions above): None, normal Incapacitation due to abnormal movements: None, normal Patient's awareness of abnormal movements (rate only patient's report): No Awareness, Dental Status Current problems with teeth and/or dentures?: Yes Does patient usually wear dentures?: No   CIWA:  CIWA-Ar Total: 1 COWS:  COWS Total Score: 1  Musculoskeletal: Strength & Muscle Tone: within normal limits Gait & Station: normal Patient leans: N/A  Psychiatric Specialty Exam:  Presentation  General Appearance: Appropriate for Environment; Disheveled  Eye Contact:Good  Speech:Clear and Coherent; Normal Rate  Speech Volume:Normal  Handedness:No data recorded  Mood and Affect  Mood:Depressed  Affect:Congruent   Thought Process  Thought Processes:Coherent; Goal Directed; Linear  Descriptions of Associations:Intact  Orientation:Full (Time, Place and Person)  Thought Content:WDL  History of Schizophrenia/Schizoaffective disorder:No  Duration of Psychotic Symptoms:No data recorded Hallucinations:No data recorded Ideas of Reference:None  Suicidal Thoughts:No data recorded Homicidal Thoughts:No  data recorded  Sensorium  Memory:Immediate Fair; Recent Fair; Remote Fair  Judgment:Fair  Insight:Fair   Executive Functions  Concentration:Good  Attention Span:Good  Recall:Good  Fund of Knowledge:Good  Language:Good   Psychomotor Activity  Psychomotor Activity:No data recorded  Assets  Assets:Communication Skills; Desire for Improvement; Leisure Time; Physical Health; Housing   Sleep  Sleep:No data recorded   Physical Exam: Physical Exam Vitals and nursing note reviewed.  Constitutional:      Appearance: Normal appearance.  HENT:     Head: Normocephalic and atraumatic.  Pulmonary:     Effort: Pulmonary effort is normal.  Neurological:     General: No focal deficit present.     Mental Status: He is alert and oriented to person, place, and time.    Review of Systems  All other systems reviewed and are negative.  Blood pressure (!) 117/94, pulse 66, temperature 98 F (36.7 C), temperature source Oral, resp. rate 16, height 6' 0.25" (1.835 m), weight 68.5 kg, SpO2 100 %. Body mass index is 20.34 kg/m.   Treatment Plan  Summary: Daily contact with patient to assess and evaluate symptoms and progress in treatment, Medication management and Plan : Patient is seen and examined.  Patient is a 60 year old male with the above-stated past psychiatric history who is seen in follow-up.   Diagnosis: 1.  Major depression, recurrent, severe without psychotic features 2.  Alcohol dependence 3.  Elevated liver function enzymes #4 thrombocytopenia  Pertinent findings on examination today: 1.  Mood is improving. 2.  Denies any withdrawal syndromes. 3.  Denies any suicidal or homicidal ideation.  Plan: 1.  Continue hydroxyzine 25 mg p.o. every 6 hours as needed anxiety. 2.  Continue loperamide 2 to 4 mg p.o. as needed loose stools. 3.  Continue mirtazapine 15 mg p.o. nightly for anxiety and depression. 4.  Stop lorazepam. 5.  Continue Neosporin ointment on wounds. 6.  Continue Protonix 40 mg p.o. daily for gastric protection. 7.  Continue thiamine 100 mg p.o. daily for nutritional supplementation. 8.  Continue trazodone 50 mg p.o. nightly as needed insomnia. 9.  Disposition planning-Dr. Mason Jim has told the patient as well as the patient's mother that he would be ready for discharge on Friday of this week.  Antonieta Pert, MD 07/12/2020, 11:54 AM

## 2020-07-12 NOTE — Progress Notes (Signed)
Dar Note:Patient presents with a flat affect and depressed mood.  Denies suicidal thoughts, auditory and visual hallucinations.  Medications given as prescribed.  Routine safety checks maintained.  Patient is withdrawn and isolates to his room.  Patient is safe on the unit.

## 2020-07-12 NOTE — Progress Notes (Signed)
Pt did not attend group. 

## 2020-07-12 NOTE — Progress Notes (Signed)
Pt did not attend goals group. 

## 2020-07-12 NOTE — Progress Notes (Signed)
Recreation Therapy Notes  Animal-Assisted Activity (AAA) Program Checklist/Progress Notes Patient Eligibility Criteria Checklist & Daily Group note for Rec Tx Intervention  Date: 5.31.22 Time: 1430 Location: [address] Hall Dayroom   AAA/T Program Assumption of Risk Form signed by Patient/ or Parent Legal Guardian YES   Patient is free of allergies or severe asthma  YES  Patient reports no fear of animals  YES  Patient reports no history of cruelty to animals YES   Patient understands his/her participation is voluntary YES  Patient washes hands before animal contact  YES   Patient washes hands after animal contact  YES  Education: Hand Washing, Appropriate Animal Interaction   Education Outcome: Acknowledges understanding/In group clarification offered/Needs additional education.   Clinical Observations/Feedback:  Pt did not attend activity.    Jaiden Dinkins, LRT/CTRS         Omya Winfield A 07/12/2020 3:50 PM 

## 2020-07-13 DIAGNOSIS — F322 Major depressive disorder, single episode, severe without psychotic features: Secondary | ICD-10-CM | POA: Diagnosis not present

## 2020-07-13 LAB — HCV INTERPRETATION

## 2020-07-13 LAB — HEPATITIS PANEL, ACUTE
HCV Ab: NONREACTIVE
Hep B C IgM: NONREACTIVE
Hepatitis B Surface Ag: NONREACTIVE

## 2020-07-13 NOTE — BHH Group Notes (Signed)
Topic: Emotions  Due to the acuity and complex discharge plans, group was not held. Patient was provided therapeutic worksheets and asked to meet with CSW as needed.  Despina Boan, LCSWA Clinicial Social Worker [facility] 

## 2020-07-13 NOTE — Progress Notes (Signed)
[facility] MD Progress Note  07/13/2020 11:17 AM Peter EatonWilliam Fuentes  MRN:  [id number]  Subjective: Peter NoaWilliam reports, "I'm doing well. I have no symptoms of depression or anxiety what so ever today. I'm taking the medicines as they are being given to me. I have been going to group sessions. My main concern now is not having any place to go after discharge. I think I may be getting discharged on Thursday".  Reason for admission: Patient is a 60 year old male with a past psychiatric history significant for depression, alcohol dependence who was transferred to the behavioral health hospital on 07/09/2020 after an attempt to kill himself by cutting his wrists bilaterally.  Objective: Patient is seen and examined.  Patient is a 60 year old male with the above-stated past psychiatric history who is seen in follow-up.  He stated he feels better today.  He stated he is not having any withdrawal symptoms, symptoms of depression or anxiety.  He stated he thinks he is going to relocate to [place] when his family moves there.  He denies any suicidal/homicidal ideation. He denies any psychotic symptoms.  He states today that he thinks he may be getting discharged on Thursday, which is tomorrow & would not have any where to go after discharge.  He denies any problems with his wounds. His pulse is 98, blood pressure is 94/78.  He slept 6.75 hours last night. Review of his admission laboratories revealed a mildly elevated liver function enzymes.  On 5/28 his AST was 74 and his ALT was 76.  His MCV was elevated at 104.4.  Platelets were 122,000.  1 year ago they were 189,000, 6 days ago was 132,000.  Blood alcohol on 5/25 was 210.  TSH was normal at 1.472.  Drug screen was positive for opiates. Willaim is in agreement to continue current plan of care as already in progress.  Principal Problem: MDD (major depressive disorder), single episode, severe (HCC)  Diagnosis: Principal Problem:   MDD (major depressive disorder), single  episode, severe (HCC) Active Problems:   Suicidal behavior with attempted self-injury (HCC)  Total Time spent with patient: 15 minutes  Past Psychiatric History: See admission H&P  Past Medical History:  Past Medical History:  Diagnosis Date  . COVID-19 01/2018  . Depression     Past Surgical History:  Procedure Laterality Date  . I & D EXTREMITY Bilateral 07/06/2020   Procedure: IRRIGATION AND DEBRIDEMENT OF RIGHT WRIST WITH COMPLEX CLOSURE , IRRIGATION AND DEBRIDEMENT OF LEFT WRIST WITH MEDIAN NERVE REPAIR, TENDON REPAIR X2, COMPLEX CLOSURE;  Surgeon: Allena NapoleonPace, Collier S, MD;  Location: [facility] OR;  Service: Plastics;  Laterality: Bilateral;   Family History: History reviewed. No pertinent family history.  Family Psychiatric  History: See admission H&P  Social History:  Social History   Substance and Sexual Activity  Alcohol Use Yes   Comment: 8     Social History   Substance and Sexual Activity  Drug Use Yes  . Types: Oxycodone    Social History   Socioeconomic History  . Marital status: Unknown    Spouse name: Not on file  . Number of children: Not on file  . Years of education: Not on file  . Highest education level: Not on file  Occupational History  . Not on file  Tobacco Use  . Smoking status: Current Every Day Smoker    Packs/day: 0.50    Years: 30.00    Pack years: 15.00  . Smokeless tobacco: Never Used  Vaping Use  .  Vaping Use: Never used  Substance and Sexual Activity  . Alcohol use: Yes    Comment: 8  . Drug use: Yes    Types: Oxycodone  . Sexual activity: Not on file  Other Topics Concern  . Not on file  Social History Narrative  . Not on file   Social Determinants of Health   Financial Resource Strain: Not on file  Food Insecurity: Not on file  Transportation Needs: Not on file  Physical Activity: Not on file  Stress: Not on file  Social Connections: Not on file   Additional Social History:   Sleep: Good  Appetite:  Good  Current  Medications: Current Facility-Administered Medications  Medication Dose Route Frequency Provider Last Rate Last Admin  . alum & mag hydroxide-simeth (MAALOX/MYLANTA) 200-200-20 MG/5ML suspension 30 mL  30 mL Oral Q4H PRN Lenard Lance, FNP      . hydrOXYzine (ATARAX/VISTARIL) tablet 25 mg  25 mg Oral Q6H PRN Antonieta Pert, MD   25 mg at 07/12/20 2124  . ibuprofen (ADVIL) tablet 600 mg  600 mg Oral Q8H PRN Comer Locket, MD   600 mg at 07/13/20 0929  . loperamide (IMODIUM) capsule 2-4 mg  2-4 mg Oral PRN Antonieta Pert, MD      . magnesium hydroxide (MILK OF MAGNESIA) suspension 30 mL  30 mL Oral Daily PRN Lenard Lance, FNP      . mirtazapine (REMERON) tablet 15 mg  15 mg Oral QHS Comer Locket, MD   15 mg at 07/12/20 2124  . multivitamin with minerals tablet 1 tablet  1 tablet Oral Daily Comer Locket, MD   1 tablet at 07/13/20 0924  . neomycin-bacitracin-polymyxin (NEOSPORIN) ointment packet   Topical Daily Comer Locket, MD   Given at 07/13/20 [phone]  . ondansetron (ZOFRAN-ODT) disintegrating tablet 4 mg  4 mg Oral Q6H PRN Antonieta Pert, MD      . pantoprazole (PROTONIX) EC tablet 40 mg  40 mg Oral Daily Comer Locket, MD   40 mg at 07/13/20 0924  . thiamine tablet 100 mg  100 mg Oral Daily Lenard Lance, FNP   100 mg at 07/13/20 0160  . traZODone (DESYREL) tablet 50 mg  50 mg Oral QHS PRN Lenard Lance, FNP   50 mg at 07/12/20 2124   Lab Results: No results found for this or any previous visit (from the past 48 hour(s)).  Blood Alcohol level:  Lab Results  Component Value Date   ETH 210 (H) 07/06/2020   ETH 137 (H) 04/10/2019   Metabolic Disorder Labs: No results found for: HGBA1C, MPG No results found for: PROLACTIN No results found for: CHOL, TRIG, HDL, CHOLHDL, VLDL, LDLCALC  Physical Findings: AIMS: Facial and Oral Movements Muscles of Facial Expression: None, normal Lips and Perioral Area: None, normal Jaw: None, normal Tongue: None,  normal,Extremity Movements Upper (arms, wrists, hands, fingers): None, normal Lower (legs, knees, ankles, toes): None, normal, Trunk Movements Neck, shoulders, hips: None, normal, Overall Severity Severity of abnormal movements (highest score from questions above): None, normal Incapacitation due to abnormal movements: None, normal Patient's awareness of abnormal movements (rate only patient's report): No Awareness, Dental Status Current problems with teeth and/or dentures?: Yes Does patient usually wear dentures?: No  CIWA:  CIWA-Ar Total: 0 COWS:  COWS Total Score: 1  Musculoskeletal: Strength & Muscle Tone: within normal limits Gait & Station: normal Patient leans: N/A  Psychiatric Specialty Exam:  Presentation  General Appearance: Fairly Groomed  Eye Contact:Good  Speech:Clear and Coherent  Speech Volume:Normal  Handedness:Right  Mood and Affect  Mood:Anxious  Affect:Congruent  Thought Process  Thought Processes:Coherent  Descriptions of Associations:Intact  Orientation:Full (Time, Place and Person)  Thought Content:Logical  History of Schizophrenia/Schizoaffective disorder:No  Duration of Psychotic Symptoms:No data recorded Hallucinations:Hallucinations: None  Ideas of Reference:None  Suicidal Thoughts:Suicidal Thoughts: No  Homicidal Thoughts:Homicidal Thoughts: No  Sensorium  Memory:Immediate Fair; Recent Fair; Remote Fair  Judgment:Fair  Insight:Fair  Executive Functions  Concentration:Good  Attention Span:Good  Recall:Good  Fund of Knowledge:Good  Language:Good  Psychomotor Activity  Psychomotor Activity:Psychomotor Activity: Increased  Assets  Assets:Desire for Improvement; Research scientist (medical); Resilience  Sleep  Sleep:Sleep: Good Number of Hours of Sleep: 6.75  Physical Exam: Physical Exam Vitals and nursing note reviewed.  Constitutional:      Appearance: Normal appearance.  HENT:     Head: Normocephalic and atraumatic.      Mouth/Throat:     Pharynx: Oropharynx is clear.  Eyes:     Pupils: Pupils are equal, round, and reactive to light.  Cardiovascular:     Rate and Rhythm: Normal rate.     Pulses: Normal pulses.  Pulmonary:     Effort: Pulmonary effort is normal.  Genitourinary:    Comments: Deferred Musculoskeletal:        General: Normal range of motion.     Cervical back: Normal range of motion.  Skin:    General: Skin is warm and dry.  Neurological:     General: No focal deficit present.     Mental Status: He is alert and oriented to person, place, and time.    Review of Systems  Constitutional: Negative.   HENT: Negative.   Eyes: Negative.   Respiratory: Negative.   Cardiovascular: Negative.   Gastrointestinal: Negative.   Genitourinary: Negative.   Musculoskeletal: Negative for myalgias.       Self inflicted laceration to left wrist wrapped in ace bandage, intatct.  Skin: Negative.   Neurological: Negative for dizziness, tingling, tremors, sensory change, speech change, focal weakness, seizures, loss of consciousness, weakness and headaches.  Endo/Heme/Allergies:       Allergies: NKDA  Psychiatric/Behavioral: Positive for substance abuse (Hx. Alcohol use disorder). Negative for depression, hallucinations, memory loss and suicidal ideas. The patient is not nervous/anxious and does not have insomnia.   All other systems reviewed and are negative.  Blood pressure 94/78, pulse 98, temperature 97.8 F (36.6 C), resp. rate 16, height 6' 0.25" (1.835 m), weight 68.5 kg, SpO2 98 %. Body mass index is 20.34 kg/m.  Treatment Plan Summary: Daily contact with patient to assess and evaluate symptoms and progress in treatment, Medication management and Plan : Patient is seen and examined.  Patient is a 60 year old male with the above-stated past psychiatric history who is seen in follow-up.   Diagnosis: 1.  Major depression, recurrent, severe without psychotic features 2.  Alcohol  dependence 3.  Elevated liver function enzymes #4 thrombocytopenia  Pertinent findings on examination today: 1.  Mood is improving. 2.  Denies any withdrawal syndromes. 3.  Denies any suicidal or homicidal ideation.  Plan: 1.  Continue hydroxyzine 25 mg p.o. every 6 hours as needed anxiety. 2.  Continue loperamide 2 to 4 mg p.o. as needed loose stools. 3.  Continue mirtazapine 15 mg p.o. nightly for anxiety and depression. 4.  Stopped lorazepam. 5.  Continue Neosporin ointment on wounds. 6.  Continue Protonix 40 mg p.o. daily for gastric protection. 7.  Continue thiamine 100 mg p.o. daily for nutritional supplementation. 8.  Continue trazodone 50 mg p.o. nightly as needed insomnia. 9.  Disposition planning-Dr. Mason Jim has told the patient as well as the patient's mother that he would be ready for discharge on Friday of this week.  Armandina Stammer, NP, pmhnp, fnp-BC 07/13/2020, 11:17 AMPatient ID: Peter Fuentes, male   DOB: Mar 16, 1960, 60 y.o.   MRN: [id number]

## 2020-07-13 NOTE — BHH Counselor (Signed)
CSW spoke with pt's mother, Peter Fuentes. She again discussed trying to get pt into an inpatient facility in [place], [place] once he is discharged from [facility]. CSW again explained that pt will not meet inpatient criteria at the time of discharge from [facility] and that pt will be appropriate to attend outpatient services. Peter Fuentes stated that she was upset that we could not give her a definite discharge date at this time. CSW explained that this is not possible due to pt's progress constantly changing. Peter Fuentes also stated that she was upset that she had not heard from the Dr. CSW explained that the MD called and left her a message yesterday due to her not answering and that he would call her again today. CSW again reitterated that if no arrangements were made for where pt will live at d/c he will be d/c'ed with bus passes with the homelessness resources he has already been provided. Peter Fuentes stated she understood but did not like this option.    CSW spoke with pt's brother, Peter Fuentes. [name] stated that no arrangements have been made for where pt will stay when he is d/c'ed from [facility]. CSW again reitterated that if no arrangements were made for where pt will live at d/c he will be d/c'ed with bus passes with the homelessness resources he has already been provided. Peter Fuentes said he will pick up the pt at d/c and take him to [facility], [place] but needed to know in advance when he will discharge. CSW explained that this is not possible to predetermine a discharge date definitvely due to pt's progress constantly changing. Peter Fuentes shared that he was worried that his brother is still depressed. Peter Fuentes stated that he would like to speak to the Dr. On his brother's case. CSW stated she would inform the MD of this request.   Peter Fuentes, LCSWA Clinicial Social Worker [employer]

## 2020-07-13 NOTE — Progress Notes (Signed)
   07/13/20 1200  Psych Admission Type (Psych Patients Only)  Admission Status Voluntary  Psychosocial Assessment  Patient Complaints Anxiety  Eye Contact Brief  Facial Expression Flat  Affect Appropriate to circumstance  Speech Logical/coherent  Interaction Assertive  Motor Activity Slow  Appearance/Hygiene In scrubs;Disheveled  Behavior Characteristics Cooperative;Appropriate to situation  Mood Depressed;Pleasant  Thought Process  Coherency WDL  Content WDL  Delusions None reported or observed  Perception WDL  Hallucination None reported or observed  Judgment Impaired  Confusion None  Danger to Self  Current suicidal ideation? Denies  Danger to Others  Danger to Others None reported or observed

## 2020-07-13 NOTE — Progress Notes (Signed)
[facility] Group Notes:  (Nursing/MHT/Case Management/Adjunct)  Date:  07/13/2020  Time:  11:47 PM  Type of Therapy:  wrap up   Participation Level:  Did Not Attend  Participation Quality:  DNA  Affect:  DNA  Cognitive:  DNA  Insight:  None  Engagement in Group:  DNA  Modes of Intervention:  DNA  Summary of Progress/Problems: Did not attend group  Annell Greening Casina 07/13/2020, 11:47 PM

## 2020-07-13 NOTE — Progress Notes (Signed)
Recreation Therapy Notes  Date: 6.1.22 Time: 0930 Location: [address] Dayroom  Group Topic: Stress Management   Goal Area(s) Addresses:  Patient will actively participate in stress management techniques presented during session.  Patient will successfully identify benefit of practicing stress management post d/c.   Intervention: Guided exercise with ambient sound and script  Activity :Guided Imagery  LRT read a beach visualization script to patients.  Patients were asked to participate in the technique introduced during introduction. Patients were given suggestions of ways to access scripts post d/c and encouraged to explore Youtube and other apps available on smartphones, tablets, and computers.   Education:  Stress Management, Discharge Planning.   Education Outcome: Acknowledges education  Clinical Observations/Feedback: Patient did not attend group session.   Caroll Rancher, LRT/CTRS         Caroll Rancher A 07/13/2020 12:41 PM

## 2020-07-13 NOTE — [facility] Group Notes (Signed)
The focus of this group is to help patients establish daily goals to achieve during treatment and discuss how the patient can incorporate goal setting into their daily lives to aide in recovery.  Pt did not attend group 

## 2020-07-13 NOTE — Plan of Care (Signed)
  Problem: Activity: Goal: Interest or engagement in activities will improve Outcome: Progressing Goal: Sleeping patterns will improve Outcome: Progressing   Problem: Coping: Goal: Ability to verbalize frustrations and anger appropriately will improve Outcome: Progressing Goal: Ability to demonstrate self-control will improve Outcome: Progressing   Problem: Safety: Goal: Periods of time without injury will increase Outcome: Progressing   Problem: Activity: Goal: Interest or engagement in leisure activities will improve Outcome: Progressing Goal: Imbalance in normal sleep/wake cycle will improve Outcome: Progressing   Problem: Coping: Goal: Coping ability will improve Outcome: Progressing Goal: Will verbalize feelings Outcome: Progressing

## 2020-07-14 DIAGNOSIS — F322 Major depressive disorder, single episode, severe without psychotic features: Secondary | ICD-10-CM | POA: Diagnosis not present

## 2020-07-14 NOTE — [facility] Counselor (Signed)
CSW spoke with pt's brother, Obrien Huskins (brother) [phone], who states he will be at [facility] to pick up his brother tomorrow at 3pm.  Fredirick Lathe, LCSWA Clinicial Social Worker [employer]

## 2020-07-14 NOTE — Progress Notes (Signed)
[facility] MD Progress Note  07/14/2020 11:22 AM Peter Fuentes  MRN:  [id number]  Subjective: Peter Fuentes reports, "I'm doing well. I'm sleeping well. I don't have any complaints. I realized that the Md did talk to my brother yesterday, I need to know what they talked about, at least to know what is up".  Reason for admission: Patient is a 60 year old male with a past psychiatric history significant for depression, alcohol dependence who was transferred to the behavioral health hospital on 07/09/2020 after an attempt to kill himself by cutting his wrists bilaterally.  Objective: Patient is seen in his room. Chart reviewed. The chart findings discussed with the treatment team. He is lying down in bed.  He presents alert, oriented & aware of situation. He is visible on the unit. He did not attend the morning group session today. He presents with a restricted affect, good eye contact. He says he is doing well. Denies any symptoms of depression or anxiety. Patient asked to be informed about the discussion the attending psychiatrist had with his brother yesterday. He was informed the discussion was that his parents has gotten in the plane to [place], their new home & the brother is on his way to [facility] to pick-up patient by tomorrow. Patient says thanks. At this time, patient is on the list to be discharged tomorrow.  He denies any problems with his wounds. His pulse is 104, blood pressure is 102/82.  He slept 6.75 hours last night. Review of his admission laboratories revealed a mildly elevated liver function enzymes.  On 5/28 his AST was 74 and his ALT was 76.  His MCV was elevated at 104.4.  Platelets were 122,000.  1 year ago they were 189,000, 6 days ago was 132,000.  Blood alcohol on 5/25 was 210.  TSH was normal at 1.472.  Drug screen was positive for opiates. Peter Fuentes is in agreement to continue current plan of care as already in progress.  Principal Problem: MDD (major depressive disorder), single episode, severe  (HCC)  Diagnosis: Principal Problem:   MDD (major depressive disorder), single episode, severe (HCC) Active Problems:   Suicidal behavior with attempted self-injury (HCC)  Total Time spent with patient: 15 minutes  Past Psychiatric History: See admission H&P  Past Medical History:  Past Medical History:  Diagnosis Date  . COVID-19 01/2018  . Depression     Past Surgical History:  Procedure Laterality Date  . I & D EXTREMITY Bilateral 07/06/2020   Procedure: IRRIGATION AND DEBRIDEMENT OF RIGHT WRIST WITH COMPLEX CLOSURE , IRRIGATION AND DEBRIDEMENT OF LEFT WRIST WITH MEDIAN NERVE REPAIR, TENDON REPAIR X2, COMPLEX CLOSURE;  Surgeon: Allena Napoleon, MD;  Location: [facility] OR;  Service: Plastics;  Laterality: Bilateral;   Family History: History reviewed. No pertinent family history.  Family Psychiatric  History: See admission H&P  Social History:  Social History   Substance and Sexual Activity  Alcohol Use Yes   Comment: 8     Social History   Substance and Sexual Activity  Drug Use Yes  . Types: Oxycodone    Social History   Socioeconomic History  . Marital status: Unknown    Spouse name: Not on file  . Number of children: Not on file  . Years of education: Not on file  . Highest education level: Not on file  Occupational History  . Not on file  Tobacco Use  . Smoking status: Current Every Day Smoker    Packs/day: 0.50    Years: 30.00  Pack years: 15.00  . Smokeless tobacco: Never Used  Vaping Use  . Vaping Use: Never used  Substance and Sexual Activity  . Alcohol use: Yes    Comment: 8  . Drug use: Yes    Types: Oxycodone  . Sexual activity: Not on file  Other Topics Concern  . Not on file  Social History Narrative  . Not on file   Social Determinants of Health   Financial Resource Strain: Not on file  Food Insecurity: Not on file  Transportation Needs: Not on file  Physical Activity: Not on file  Stress: Not on file  Social Connections: Not on  file   Additional Social History:   Sleep: Good  Appetite:  Good  Current Medications: Current Facility-Administered Medications  Medication Dose Route Frequency Provider Last Rate Last Admin  . alum & mag hydroxide-simeth (MAALOX/MYLANTA) 200-200-20 MG/5ML suspension 30 mL  30 mL Oral Q4H PRN Lenard Lance, FNP      . hydrOXYzine (ATARAX/VISTARIL) tablet 25 mg  25 mg Oral Q6H PRN Antonieta Pert, MD   25 mg at 07/12/20 2124  . ibuprofen (ADVIL) tablet 600 mg  600 mg Oral Q8H PRN Comer Locket, MD   600 mg at 07/13/20 0929  . loperamide (IMODIUM) capsule 2-4 mg  2-4 mg Oral PRN Antonieta Pert, MD      . magnesium hydroxide (MILK OF MAGNESIA) suspension 30 mL  30 mL Oral Daily PRN Lenard Lance, FNP      . mirtazapine (REMERON) tablet 15 mg  15 mg Oral QHS Comer Locket, MD   15 mg at 07/13/20 2128  . multivitamin with minerals tablet 1 tablet  1 tablet Oral Daily Comer Locket, MD   1 tablet at 07/14/20 9833  . neomycin-bacitracin-polymyxin (NEOSPORIN) ointment packet   Topical Daily Comer Locket, MD   1 application at 07/14/20 0813  . ondansetron (ZOFRAN-ODT) disintegrating tablet 4 mg  4 mg Oral Q6H PRN Antonieta Pert, MD      . pantoprazole (PROTONIX) EC tablet 40 mg  40 mg Oral Daily Comer Locket, MD   40 mg at 07/14/20 8250  . thiamine tablet 100 mg  100 mg Oral Daily Lenard Lance, FNP   100 mg at 07/14/20 5397  . traZODone (DESYREL) tablet 50 mg  50 mg Oral QHS PRN Lenard Lance, FNP   50 mg at 07/13/20 2128   Lab Results: No results found for this or any previous visit (from the past 48 hour(s)).  Blood Alcohol level:  Lab Results  Component Value Date   ETH 210 (H) 07/06/2020   ETH 137 (H) 04/10/2019   Metabolic Disorder Labs: No results found for: HGBA1C, MPG No results found for: PROLACTIN No results found for: CHOL, TRIG, HDL, CHOLHDL, VLDL, LDLCALC  Physical Findings: AIMS: Facial and Oral Movements Muscles of Facial Expression: None,  normal Lips and Perioral Area: None, normal Jaw: None, normal Tongue: None, normal,Extremity Movements Upper (arms, wrists, hands, fingers): None, normal Lower (legs, knees, ankles, toes): None, normal, Trunk Movements Neck, shoulders, hips: None, normal, Overall Severity Severity of abnormal movements (highest score from questions above): None, normal Incapacitation due to abnormal movements: None, normal Patient's awareness of abnormal movements (rate only patient's report): No Awareness, Dental Status Current problems with teeth and/or dentures?: Yes Does patient usually wear dentures?: No  CIWA:  CIWA-Ar Total: 0 COWS:  COWS Total Score: 1  Musculoskeletal: Strength & Muscle Tone: within  normal limits Gait & Station: normal Patient leans: N/A  Psychiatric Specialty Exam:  Presentation  General Appearance: Disheveled  Eye Contact:Good  Speech:Normal Rate; Clear and Coherent  Speech Volume:Normal  Handedness:Right  Mood and Affect  Mood:Euthymic  Affect:Other (comment) (Restricted)  Thought Process  Thought Processes:Coherent; Linear  Descriptions of Associations:Intact  Orientation:Full (Time, Place and Person)  Thought Content:Logical  History of Schizophrenia/Schizoaffective disorder:No  Duration of Psychotic Symptoms:No data recorded Hallucinations:Hallucinations: None  Ideas of Reference:None  Suicidal Thoughts:Suicidal Thoughts: No  Homicidal Thoughts:Homicidal Thoughts: No  Sensorium  Memory:Immediate Good; Recent Good; Remote Good  Judgment:Fair  Insight:Fair  Executive Functions  Concentration:Good  Attention Span:Good  Recall:Good  Fund of Knowledge:Good  Language:Good  Psychomotor Activity  Psychomotor Activity:Psychomotor Activity: Normal  Assets  Assets:Communication Skills; Desire for Improvement; Social Support  Sleep  Sleep:Sleep: Good Number of Hours of Sleep: 6.75  Physical Exam: Physical Exam Vitals and  nursing note reviewed.  Constitutional:      Appearance: Normal appearance.  HENT:     Head: Normocephalic and atraumatic.     Mouth/Throat:     Pharynx: Oropharynx is clear.  Eyes:     Pupils: Pupils are equal, round, and reactive to light.  Cardiovascular:     Rate and Rhythm: Normal rate.     Pulses: Normal pulses.  Pulmonary:     Effort: Pulmonary effort is normal.  Genitourinary:    Comments: Deferred Musculoskeletal:        General: Normal range of motion.     Cervical back: Normal range of motion.  Skin:    General: Skin is warm and dry.  Neurological:     General: No focal deficit present.     Mental Status: He is alert and oriented to person, place, and time.    Review of Systems  Constitutional: Negative.   HENT: Negative.   Eyes: Negative.   Respiratory: Negative.   Cardiovascular: Negative.   Gastrointestinal: Negative.   Genitourinary: Negative.   Musculoskeletal: Negative for myalgias.       Self inflicted laceration to left wrist wrapped in ace bandage, intatct.  Skin: Negative.   Neurological: Negative for dizziness, tingling, tremors, sensory change, speech change, focal weakness, seizures, loss of consciousness, weakness and headaches.  Endo/Heme/Allergies:       Allergies: NKDA  Psychiatric/Behavioral: Positive for substance abuse (Hx. Alcohol use disorder). Negative for depression, hallucinations, memory loss and suicidal ideas. The patient is not nervous/anxious and does not have insomnia.   All other systems reviewed and are negative.  Blood pressure 102/82, pulse (!) 104, temperature 98.5 F (36.9 C), temperature source Oral, resp. rate 18, height 6' 0.25" (1.835 m), weight 68.5 kg, SpO2 100 %. Body mass index is 20.34 kg/m.  Treatment Plan Summary: Daily contact with patient to assess and evaluate symptoms and progress in treatment, Medication management and Plan : Patient is seen and examined.  Patient is a 60 year old male with the  above-stated past psychiatric history who is seen in follow-up.   Diagnosis: 1.  Major depression, recurrent, severe without psychotic features 2.  Alcohol dependence 3.  Elevated liver function enzymes #4 thrombocytopenia  Pertinent findings on examination today: 1.  Mood is improving. 2.  Denies any withdrawal syndromes. 3.  Denies any suicidal or homicidal ideation.  Plan: 1.  Continue hydroxyzine 25 mg p.o. every 6 hours as needed anxiety. 2.  Continue loperamide 2 to 4 mg p.o. as needed loose stools. 3.  Continue mirtazapine 15 mg p.o. nightly for anxiety  and depression. 4.  Stopped lorazepam. 5.  Continue Neosporin ointment on wounds. 6.  Continue Protonix 40 mg p.o. daily for gastric protection. 7.  Continue thiamine 100 mg p.o. daily for nutritional supplementation. 8.  Continue trazodone 50 mg p.o. nightly as needed insomnia. 9.  Disposition planning-Dr. Mason JimSingleton has told the patient as well as the patient's mother that he would be ready for discharge on Friday of this week.  Peter StammerAgnes Tarynn Garling, NP, pmhnp, fnp-BC 07/14/2020, 11:22 AMPatient ID: Rebecca EatonWilliam Renton, male   DOB: 06/29/1960, 60 y.o.   MRN: [id number] Patient ID: Rebecca EatonWilliam Dejaynes, male   DOB: 08/20/1960, 60 y.o.   MRN: [id number]

## 2020-07-14 NOTE — Progress Notes (Signed)
Pt has been med compliant, denied SI/HI/AVH, cast on left hand intact, dressing on right wrist CDI, pt able to move digits on the casted extremity without any difficulty. No signs of alcohol withdrawal, fluid intake encouraged, pt received prn Trazodone for insomnia, effective on follow up. No falls this shift. He is resting in bed being monitored as ordered.

## 2020-07-14 NOTE — Progress Notes (Signed)
   07/14/20 1000  Psych Admission Type (Psych Patients Only)  Admission Status Voluntary  Psychosocial Assessment  Patient Complaints None  Eye Contact Brief  Facial Expression Flat  Affect Appropriate to circumstance  Speech Logical/coherent  Interaction Assertive  Motor Activity Slow  Appearance/Hygiene In scrubs  Behavior Characteristics Cooperative  Mood Depressed  Thought Process  Coherency Concrete thinking  Content WDL  Delusions None reported or observed  Perception WDL  Hallucination None reported or observed  Judgment Impaired  Confusion None  Danger to Self  Current suicidal ideation? Denies  Danger to Others  Danger to Others None reported or observed

## 2020-07-14 NOTE — Progress Notes (Signed)
Did not attend group 

## 2020-07-14 NOTE — Progress Notes (Signed)
Pt did not attend nutrition group today.

## 2020-07-15 MED ORDER — MIRTAZAPINE 15 MG PO TABS: 15.0000 mg | ORAL_TABLET | Freq: Every day | ORAL | 0 refills | Status: AC

## 2020-07-15 MED ORDER — HYDROXYZINE HCL 25 MG PO TABS: 25.0000 mg | ORAL_TABLET | Freq: Four times a day (QID) | ORAL | 0 refills | Status: AC | PRN

## 2020-07-15 MED ORDER — TRAZODONE HCL 50 MG PO TABS: 50.0000 mg | ORAL_TABLET | Freq: Every evening | ORAL | 0 refills | Status: AC | PRN

## 2020-07-15 MED ORDER — PANTOPRAZOLE SODIUM 40 MG PO TBEC: 40.0000 mg | DELAYED_RELEASE_TABLET | Freq: Every day | ORAL | 0 refills | Status: AC

## 2020-07-15 MED ORDER — BACITRACIN-NEOMYCIN-POLYMYXIN 400-5-5000 EX OINT: TOPICAL_OINTMENT | Freq: Every day | CUTANEOUS | 0 refills | Status: AC

## 2020-07-15 NOTE — Progress Notes (Signed)
Pt discharged to lobby. Pt was stable and appreciative at that time. All papers, samples and prescriptions were given and valuables returned. Verbal understanding expressed. Denies SI/HI and A/VH. Pt given opportunity to express concerns and ask questions.  

## 2020-07-15 NOTE — Progress Notes (Signed)
   07/15/20 0200  Psych Admission Type (Psych Patients Only)  Admission Status Voluntary  Psychosocial Assessment  Patient Complaints None  Eye Contact Brief  Facial Expression Flat  Affect Appropriate to circumstance  Speech Logical/coherent  Interaction Assertive  Motor Activity Slow  Appearance/Hygiene In scrubs  Behavior Characteristics Appropriate to situation;Cooperative  Mood Depressed  Thought Administrator, sports thinking  Content WDL  Delusions None reported or observed  Perception WDL  Hallucination None reported or observed  Judgment Impaired  Confusion None  Danger to Self  Current suicidal ideation? Denies  Danger to Others  Danger to Others None reported or observed

## 2020-07-15 NOTE — [facility] Suicide Risk Assessment (Signed)
[facility] Discharge Suicide Risk Assessment   Principal Problem: MDD (major depressive disorder), single episode, severe (HCC) Discharge Diagnoses: Principal Problem:   MDD (major depressive disorder), single episode, severe (HCC) Active Problems:   Suicidal behavior with attempted self-injury (HCC)   Total Time spent with patient: 20 minutes  Musculoskeletal: Strength & Muscle Tone: within normal limits Gait & Station: normal Patient leans: N/A  Psychiatric Specialty Exam  Presentation  General Appearance: Appropriate for Environment  Eye Contact:Good  Speech:Clear and Coherent  Speech Volume:Normal  Handedness:Right   Mood and Affect  Mood:Euthymic  Duration of Depression Symptoms: Greater than two weeks  Affect:Appropriate   Thought Process  Thought Processes:Coherent  Descriptions of Associations:Intact  Orientation:Full (Time, Place and Person)  Thought Content:Logical  History of Schizophrenia/Schizoaffective disorder:No  Duration of Psychotic Symptoms:No data recorded Hallucinations:Hallucinations: None  Ideas of Reference:None  Suicidal Thoughts:Suicidal Thoughts: No  Homicidal Thoughts:Homicidal Thoughts: No   Sensorium  Memory:Immediate Good; Recent Good; Remote Good  Judgment:Intact  Insight:Fair   Executive Functions  Concentration:Good  Attention Span:Good  Recall:Good  Fund of Knowledge:Good  Language:Good   Psychomotor Activity  Psychomotor Activity:Psychomotor Activity: Normal   Assets  Assets:Communication Skills; Desire for Improvement; Resilience; Social Support; Housing; Talents/Skills   Sleep  Sleep:Sleep: Good Number of Hours of Sleep: 6.75   Physical Exam: Physical Exam Vitals and nursing note reviewed.    Review of Systems  Skin: Positive for itching.  All other systems reviewed and are negative.  Blood pressure (!) 123/94, pulse 92, temperature 98.3 F (36.8 C), temperature source Oral, resp.  rate 18, height 6' 0.25" (1.835 m), weight 68.5 kg, SpO2 99 %. Body mass index is 20.34 kg/m.  Mental Status Per Nursing Assessment::   On Admission:  NA  Demographic Factors:  Male, Divorced or widowed, Caucasian, Low socioeconomic status and Unemployed  Loss Factors: Decrease in vocational status and Financial problems/change in socioeconomic status  Historical Factors: Impulsivity  Risk Reduction Factors:   Living with another person, especially a relative and Positive social support  Continued Clinical Symptoms:  Depression:   Comorbid alcohol abuse/dependence Impulsivity Alcohol/Substance Abuse/Dependencies  Cognitive Features That Contribute To Risk:  None    Suicide Risk:  Minimal: No identifiable suicidal ideation.  Patients presenting with no risk factors but with morbid ruminations; may be classified as minimal risk based on the severity of the depressive symptoms   Follow-up Information    [facility]. Go to.   Specialty: Urgent Care Why: You have an appointment on 07/18/20 at 7:45 am for therapy services and medication management services. These appointments will be held in person.  Contact information: [phone] [place] [address] [phone]       [facility] Follow up.   Why: Please go to this facility Monday-Friday 8am-2pm for an assessment to be established with medication management and therapy services.  Contact information: [address] [place], [place] [address] [phone]              Plan Of Care/Follow-up recommendations:  Activity:  ad lib Other:  Go to either a primary care office, ED or urgent care to have sutures removed early next week after your arrival in [place].  Antonieta Pert, MD 07/15/2020, 8:14 AM

## 2020-07-15 NOTE — Tx Team (Signed)
Interdisciplinary Treatment and Diagnostic Plan Update  07/15/2020 Time of Session: 9:20am Rennie Rouch MRN: [id number]  Principal Diagnosis: MDD (major depressive disorder), single episode, severe (HCC)  Secondary Diagnoses: Principal Problem:   MDD (major depressive disorder), single episode, severe (HCC) Active Problems:   Suicidal behavior with attempted self-injury (HCC)   Current Medications:  Current Facility-Administered Medications  Medication Dose Route Frequency Provider Last Rate Last Admin  . alum & mag hydroxide-simeth (MAALOX/MYLANTA) 200-200-20 MG/5ML suspension 30 mL  30 mL Oral Q4H PRN Lenard Lance, FNP      . hydrOXYzine (ATARAX/VISTARIL) tablet 25 mg  25 mg Oral Q6H PRN Antonieta Pert, MD   25 mg at 07/12/20 2124  . ibuprofen (ADVIL) tablet 600 mg  600 mg Oral Q8H PRN Comer Locket, MD   600 mg at 07/13/20 0929  . loperamide (IMODIUM) capsule 2-4 mg  2-4 mg Oral PRN Antonieta Pert, MD      . magnesium hydroxide (MILK OF MAGNESIA) suspension 30 mL  30 mL Oral Daily PRN Lenard Lance, FNP      . mirtazapine (REMERON) tablet 15 mg  15 mg Oral QHS Comer Locket, MD   15 mg at 07/14/20 2122  . multivitamin with minerals tablet 1 tablet  1 tablet Oral Daily Comer Locket, MD   1 tablet at 07/15/20 0817  . neomycin-bacitracin-polymyxin (NEOSPORIN) ointment packet   Topical Daily Comer Locket, MD   Given at 07/15/20 0818  . ondansetron (ZOFRAN-ODT) disintegrating tablet 4 mg  4 mg Oral Q6H PRN Antonieta Pert, MD      . pantoprazole (PROTONIX) EC tablet 40 mg  40 mg Oral Daily Comer Locket, MD   40 mg at 07/15/20 0817  . thiamine tablet 100 mg  100 mg Oral Daily Lenard Lance, FNP   100 mg at 07/15/20 0817  . traZODone (DESYREL) tablet 50 mg  50 mg Oral QHS PRN Lenard Lance, FNP   50 mg at 07/13/20 2128   PTA Medications: No medications prior to admission.    Patient Stressors: Marital or family conflict Medication change or  noncompliance  Patient Strengths: Geographical information systems officer for treatment/growth  Treatment Modalities: Medication Management, Group therapy, Case management,  1 to 1 session with clinician, Psychoeducation, Recreational therapy.   Physician Treatment Plan for Primary Diagnosis: MDD (major depressive disorder), single episode, severe (HCC) Long Term Goal(s): Improvement in symptoms so as ready for discharge Improvement in symptoms so as ready for discharge   Short Term Goals: Ability to identify changes in lifestyle to reduce recurrence of condition will improve Ability to demonstrate self-control will improve Ability to identify and develop effective coping behaviors will improve Ability to identify triggers associated with substance abuse/mental health issues will improve  Medication Management: Evaluate patient's response, side effects, and tolerance of medication regimen.  Therapeutic Interventions: 1 to 1 sessions, Unit Group sessions and Medication administration.  Evaluation of Outcomes: Adequate for Discharge  Physician Treatment Plan for Secondary Diagnosis: Principal Problem:   MDD (major depressive disorder), single episode, severe (HCC) Active Problems:   Suicidal behavior with attempted self-injury (HCC)  Long Term Goal(s): Improvement in symptoms so as ready for discharge Improvement in symptoms so as ready for discharge   Short Term Goals: Ability to identify changes in lifestyle to reduce recurrence of condition will improve Ability to demonstrate self-control will improve Ability to identify and develop effective coping behaviors will improve Ability to identify triggers  associated with substance abuse/mental health issues will improve     Medication Management: Evaluate patient's response, side effects, and tolerance of medication regimen.  Therapeutic Interventions: 1 to 1 sessions, Unit Group sessions and Medication administration.  Evaluation  of Outcomes: Adequate for Discharge   RN Treatment Plan for Primary Diagnosis: MDD (major depressive disorder), single episode, severe (HCC) Long Term Goal(s): Knowledge of disease and therapeutic regimen to maintain health will improve  Short Term Goals: Ability to remain free from injury will improve, Ability to verbalize frustration and anger appropriately will improve, Ability to identify and develop effective coping behaviors will improve and Compliance with prescribed medications will improve  Medication Management: RN will administer medications as ordered by provider, will assess and evaluate patient's response and provide education to patient for prescribed medication. RN will report any adverse and/or side effects to prescribing provider.  Therapeutic Interventions: 1 on 1 counseling sessions, Psychoeducation, Medication administration, Evaluate responses to treatment, Monitor vital signs and CBGs as ordered, Perform/monitor CIWA, COWS, AIMS and Fall Risk screenings as ordered, Perform wound care treatments as ordered.  Evaluation of Outcomes: Adequate for Discharge   LCSW Treatment Plan for Primary Diagnosis: MDD (major depressive disorder), single episode, severe (HCC) Long Term Goal(s): Safe transition to appropriate next level of care at discharge, Engage patient in therapeutic group addressing interpersonal concerns.  Short Term Goals: Engage patient in aftercare planning with referrals and resources, Increase social support, Increase ability to appropriately verbalize feelings, Increase emotional regulation, Identify triggers associated with mental health/substance abuse issues and Increase skills for wellness and recovery  Therapeutic Interventions: Assess for all discharge needs, 1 to 1 time with Social worker, Explore available resources and support systems, Assess for adequacy in community support network, Educate family and significant other(s) on suicide prevention, Complete  Psychosocial Assessment, Interpersonal group therapy.  Evaluation of Outcomes: Adequate for Discharge   Progress in Treatment: Attending groups: Yes. Participating in groups: Yes. and No. Taking medication as prescribed: Yes. Toleration medication: Yes. Family/Significant other contact made: No, will contact:  mother Patient understands diagnosis: Yes. Discussing patient identified problems/goals with staff: Yes. Medical problems stabilized or resolved: Yes. Denies suicidal/homicidal ideation: Yes. Issues/concerns per patient self-inventory: No.   New problem(s) identified: No, Describe:  none  New Short Term/Long Term Goal(s): detox, medication management for mood stabilization; elimination of SI thoughts; development of comprehensive mental wellness/sobriety plan  Patient Goals:  Did not attend  Discharge Plan or Barriers: Patient recently admitted. CSW will continue to follow and assess for appropriate referrals and possible discharge planning.    Reason for Continuation of Hospitalization: Depression Medication stabilization Suicidal ideation  Estimated Length of Stay: 3-5 days  Attendees: Patient: Did not attend 07/15/2020   Physician:  07/15/2020   Nursing:  07/15/2020   RN Care Manager: 07/15/2020   Social Worker: Ruthann Cancer, LCSW 07/15/2020   Recreational Therapist:  07/15/2020   Other:  07/15/2020   Other:  07/15/2020  Other: 07/15/2020     Scribe for Treatment Team: Felizardo Hoffmann, LCSWA 07/15/2020 10:42 AM

## 2020-07-15 NOTE — Discharge Summary (Signed)
Physician Discharge Summary Note  Patient:  Peter Fuentes is an 60 y.o., male  MRN:  [id number]  DOB:  1960-06-27  Patient phone:  [phone] (home)   Patient address:   [address] [place] [address],  Total Time spent with patient: Greater than 30 minutes  Date of Admission:  07/08/2020  Date of Discharge: 07-15-20  Reason for Admission: Suicide attempt by cutting his bilateral wrists after finding out his mother and stepfather are relocating to another state.   Principal Problem: MDD (major depressive disorder), single episode, severe (HCC)  Discharge Diagnoses: Principal Problem:   MDD (major depressive disorder), single episode, severe (HCC) Active Problems:   Suicidal behavior with attempted self-injury [facility])  Past Psychiatric History: See H&P  Past Medical History:  Past Medical History:  Diagnosis Date  . COVID-19 01/2018  . Depression     Past Surgical History:  Procedure Laterality Date  . I & D EXTREMITY Bilateral 07/06/2020   Procedure: IRRIGATION AND DEBRIDEMENT OF RIGHT WRIST WITH COMPLEX CLOSURE , IRRIGATION AND DEBRIDEMENT OF LEFT WRIST WITH MEDIAN NERVE REPAIR, TENDON REPAIR X2, COMPLEX CLOSURE;  Surgeon: Allena Napoleon, MD;  Location: [facility] OR;  Service: Plastics;  Laterality: Bilateral;   Family History: History reviewed. No pertinent family history.  Family Psychiatric  History: See H&P  Social History:  Social History   Substance and Sexual Activity  Alcohol Use Yes   Comment: 8     Social History   Substance and Sexual Activity  Drug Use Yes  . Types: Oxycodone    Social History   Socioeconomic History  . Marital status: Unknown    Spouse name: Not on file  . Number of children: Not on file  . Years of education: Not on file  . Highest education level: Not on file  Occupational History  . Not on file  Tobacco Use  . Smoking status: Current Every Day Smoker    Packs/day: 0.50    Years: 30.00    Pack years: 15.00   . Smokeless tobacco: Never Used  Vaping Use  . Vaping Use: Never used  Substance and Sexual Activity  . Alcohol use: Yes    Comment: 8  . Drug use: Yes    Types: Oxycodone  . Sexual activity: Not on file  Other Topics Concern  . Not on file  Social History Narrative  . Not on file   Social Determinants of Health   Financial Resource Strain: Not on file  Food Insecurity: Not on file  Transportation Needs: Not on file  Physical Activity: Not on file  Stress: Not on file  Social Connections: Not on file   Hospital Course: (Per Md's admission evaluation notes): The patient is a 60y/o male with no known past psychiatric history, who was admitted under IVC from [facility] after a suicide attempt by cutting his bilateral wrists after finding out his mother and stepfather are relocating to another state. He was taken to the OR by plastic surgery and was noted to have no tendon or nerve injury in his right wrist and 2 wrist flexors that were partially transected in the left wrist. He was medically cleared and transferred to [facility] for continued inpatient management. He reports that he moved from [place] 2 years ago to help take care of his mother and stepfather and just found out earlier this month that his parents were accepted off a waiting list for an assisted living in [place] with plans to move next Tuesday.  His mother informed him of her plans to sell the family home and this triggered him to feel hopeless due to housing situation. He states the suicide attempt was impulsive in the context of feeling he had "no options." He states he cannot afford an apartment or extended stay hotel and is not sure where he will live once his parents move. He denies current SI, intent or plan and denies AVH, paranoia, or delusions. He denies h/o mania or hypomania. He denies prior suicide attempts. His BAL in the ED was 210 and he admitted to drinking 2-3 cans of beer about 3 days/week. He denies current signs of  withdrawal or cravings. He denied other recent illicit drug use. Leading up to the attempt, he admits that for the last month since finding out the news that his parents are relocating, he has felt sad, hopeless, ruminative about his housing situation, and has had fair appetite and fair focus. He endorses anhedonia, sense of worry, and feeling as if things are more of an effort. He has been pushing himself to go to work but has been "going through the motions." He reports fair sleep and energy. See H&P for additional details.   Prior to this discharge, Peter Fuentes  was seen & evaluated for mental health stability. The current laboratory findings were reviewed (stable), nurses notes & vital signs were reviewed as well. There are no current mental health or medical issues that should prevent this discharge at this time. Patient is being discharged to continue mental health care as noted below.   After the above admission evaluation, Peter Fuentes's presenting symptoms were noted. He was recommended for mood stabilization treatments. The medication regimen targeting those presenting symptoms were discussed with him & initiated with his consent. He was medicated, stabilized & discharged on the medications as listed on his discharge medication lists below. Besides the mood stabilization treatments, he was also enrolled & participated in the group counseling sessions being offered & held on this unit. He learned coping skills. He also presented other significant pre-existing medical issues that required monitoring (self-inflicted laceration to inner left wrist with cast). His wound was monitored & assessed for any redness, drainage or swelling. The wound remained intact, free from infection from admission through this daischarge. He tolerated his treatment regimen without any adverse effects or reactions reported. Williams symptoms responded well to his treatment regimen warranting this discharge.  During the course of his  hospitalization, the 15-minute checks were adequate to ensure Peter Fuentes's safety.  Patient did not display any dangerous violent or suicidal behavior on the unit.  He interacted with patients & staff appropriately, participated appropriately in the group sessions/therapies. His medications were addressed & adjusted to meet his needs. He was recommended for outpatient follow-up care & medication management upon discharge to assure continuity of care & mood stability.  At the time of discharge, patient is not reporting any acute suicidal/homicidal ideations. He feels more confident about his self-care & in managing the suicidal ideations. He currently denies any new issues or concerns. Education and supportive counseling provided throughout his hospital stay & upon discharge.  Today upon his discharge evaluation with the attending psychiatrist, Peter Fuentes shares he is doing well. He denies any other specific concerns. He is sleeping well. His appetite is good. He denies other physical complaints. He denies AH/VH. He feels he benefited from this hospitalization. He was able to engage in safety planning including plan to return to [facility] or contact emergency services if he feels unable to  maintain his own safety or the safety of others. Pt had no further questions, comments, or concerns. He left [facility] with all personal belongings in no apparent distress. Transportation per his brother.  Physical Findings: AIMS: Facial and Oral Movements Muscles of Facial Expression: None, normal Lips and Perioral Area: None, normal Jaw: None, normal Tongue: None, normal,Extremity Movements Upper (arms, wrists, hands, fingers): None, normal Lower (legs, knees, ankles, toes): None, normal, Trunk Movements Neck, shoulders, hips: None, normal, Overall Severity Severity of abnormal movements (highest score from questions above): None, normal Incapacitation due to abnormal movements: None, normal Patient's awareness of abnormal  movements (rate only patient's report): No Awareness, Dental Status Current problems with teeth and/or dentures?: Yes Does patient usually wear dentures?: No  CIWA:  CIWA-Ar Total: 0 COWS:  COWS Total Score: 1  Musculoskeletal: Strength & Muscle Tone: within normal limits Gait & Station: normal Patient leans: N/A  Psychiatric Specialty Exam:  Presentation  General Appearance: Appropriate for Environment  Eye Contact:Good  Speech:Clear and Coherent  Speech Volume:Normal  Handedness:Right  Mood and Affect  Mood:Euthymic  Affect:Appropriate  Thought Process  Thought Processes:Coherent  Descriptions of Associations:Intact  Orientation:Full (Time, Place and Person)  Thought Content:Logical  History of Schizophrenia/Schizoaffective disorder:No  Duration of Psychotic Symptoms:No data recorded Hallucinations:Hallucinations: None  Ideas of Reference:None  Suicidal Thoughts:Suicidal Thoughts: No  Homicidal Thoughts:Homicidal Thoughts: No  Sensorium  Memory:Immediate Good; Recent Good; Remote Good  Judgment:Intact  Insight:Fair  Executive Functions  Concentration:Good  Attention Span:Good  Recall:Good  Fund of Knowledge:Good  Language:Good  Psychomotor Activity  Psychomotor Activity:Psychomotor Activity: Normal  Assets  Assets:Communication Skills; Desire for Improvement; Resilience; Social Support; Housing; Talents/Skills  Sleep  Sleep:Sleep: Good Number of Hours of Sleep: 6.75  Physical Exam: Physical Exam Vitals and nursing note reviewed.  HENT:     Head: Normocephalic.     Nose: Nose normal.     Mouth/Throat:     Pharynx: Oropharynx is clear.  Eyes:     Pupils: Pupils are equal, round, and reactive to light.  Cardiovascular:     Rate and Rhythm: Normal rate.     Pulses: Normal pulses.  Pulmonary:     Effort: Pulmonary effort is normal.  Genitourinary:    Comments: Deferred Musculoskeletal:        General: Normal range of motion.      Cervical back: Normal range of motion.  Skin:    General: Skin is warm and dry.  Neurological:     General: No focal deficit present.     Mental Status: He is alert and oriented to person, place, and time. Mental status is at baseline.    Review of Systems  Constitutional: Negative.   HENT: Negative.   Eyes: Negative.   Respiratory: Negative.   Cardiovascular: Negative.   Gastrointestinal: Negative.   Genitourinary: Negative.   Musculoskeletal: Negative.   Skin: Negative.   Neurological: Negative.   Endo/Heme/Allergies: Negative.   Psychiatric/Behavioral: Positive for substance abuse (Hx. alcohol/opioid use disorders). Negative for depression, hallucinations, memory loss and suicidal ideas. The patient is not nervous/anxious and does not have insomnia.    Blood pressure (!) 123/94, pulse 92, temperature 98.3 F (36.8 C), temperature source Oral, resp. rate 18, height 6' 0.25" (1.835 m), weight 68.5 kg, SpO2 99 %. Body mass index is 20.34 kg/m.  Have you used any form of tobacco in the last 30 days? (Cigarettes, Smokeless Tobacco, Cigars, and/or Pipes): Yes  Has this patient used any form of tobacco in the last 30  days? (Cigarettes, Smokeless Tobacco, Cigars, and/or Pipes): N/A  Blood Alcohol level:  Lab Results  Component Value Date   ETH 210 (H) 07/06/2020   ETH 137 (H) 04/10/2019   Metabolic Disorder Labs:  No results found for: HGBA1C, MPG No results found for: PROLACTIN No results found for: CHOL, TRIG, HDL, CHOLHDL, VLDL, LDLCALC  See Psychiatric Specialty Exam and Suicide Risk Assessment completed by Attending Physician prior to discharge.  Discharge destination:  Home  Is patient on multiple antipsychotic therapies at discharge:  No   Has Patient had three or more failed trials of antipsychotic monotherapy by history:  No  Recommended Plan for Multiple Antipsychotic Therapies: NA Discharge Instructions    Activity as tolerated - No restrictions   Complete  by: As directed    Discharge instructions   Complete by: As directed    Wound care prn.   Discharge wound care:   Complete by: As directed    Monitor wound for any drainage, excessive pain, swelling or redness.     Allergies as of 07/15/2020   No Known Allergies     Medication List    TAKE these medications     Indication  hydrOXYzine 25 MG tablet Commonly known as: ATARAX/VISTARIL Take 1 tablet (25 mg total) by mouth every 6 (six) hours as needed (anxiety).  Indication: Feeling Anxious   mirtazapine 15 MG tablet Commonly known as: REMERON Take 1 tablet (15 mg total) by mouth at bedtime. For depression/sleep  Indication: Major Depressive Disorder, Insomnia   neomycin-bacitracin-polymyxin ointment Commonly known as: NEOSPORIN Apply topically daily. (May buy from over the counter): for wound care Start taking on: July 16, 2020  Indication: Wound care   pantoprazole 40 MG tablet Commonly known as: PROTONIX Take 1 tablet (40 mg total) by mouth daily. For acid reflux Start taking on: July 16, 2020  Indication: Gastroesophageal Reflux Disease   traZODone 50 MG tablet Commonly known as: DESYREL Take 1 tablet (50 mg total) by mouth at bedtime as needed for sleep.  Indication: Trouble Sleeping       Follow-up Information    [facility]. Go to.   Specialty: Urgent Care Why: You have an appointment on 07/18/20 at 7:45 am for therapy services and medication management services. These appointments will be held in person.  Contact information: [address] [place] [address] [phone]       [facility] Follow up.   Why: Please go to this facility Monday-Friday 8am-2pm for an assessment to be established with medication management and therapy services.  Contact information: [address] [place], [place] [address] [phone]             Follow-up recommendations: Activity:  As tolerated Diet: As  recommended by your primary care doctor. Keep all scheduled follow-up appointments as recommended.    Comments: Prescriptions given at discharge.  Patient agreeable to plan.  Given opportunity to ask questions.  Appears to feel comfortable with discharge denies any current suicidal or homicidal thought. Patient is also instructed prior to discharge to: Take all medications as prescribed by his/her mental healthcare provider. Report any adverse effects and or reactions from the medicines to his/her outpatient provider promptly. Patient has been instructed & cautioned: To not engage in alcohol and or illegal drug use while on prescription medicines. In the event of worsening symptoms, patient is instructed to call the crisis hotline, 911 and or go to the nearest ED for appropriate evaluation and treatment of  symptoms. To follow-up with his/her primary care provider for your other medical issues, concerns and or health care needs.  Signed: Armandina Stammer, NP, PMHNP, FNP-BC 07/15/2020, 9:53 AM

## 2020-07-15 NOTE — Progress Notes (Signed)
  [facility] Adult Case Management Discharge Plan :  Will you be returning to the same living situation after discharge:  No. At discharge, do you have transportation home?: Yes,  brother Do you have the ability to pay for your medications: Yes,  family support  Release of information consent forms completed and in the chart;  Patient's signature needed at discharge.  Patient to Follow up at:  Follow-up Information    [facility]. Go to.   Specialty: Urgent Care Why: You have an appointment on 07/18/20 at 7:45 am for therapy services and medication management services. These appointments will be held in person.  Contact information: [address] [place] [address] [phone]       [facility] Follow up.   Why: Please go to this facility Monday-Friday 8am-2pm for an assessment to be established with medication management and therapy services.  Contact information: [address] [place], [place] [address] [phone]              Next level of care provider has access to [facility] Link:yes  Safety Planning and Suicide Prevention discussed: Yes,  mother and brother  Have you used any form of tobacco in the last 30 days? (Cigarettes, Smokeless Tobacco, Cigars, and/or Pipes): Yes  Has patient been referred to the Quitline?: Patient refused referral  Patient has been referred for addiction treatment: N/A  Felizardo Hoffmann, LCSWA 07/15/2020, 10:05 AM

## 2020-07-15 NOTE — [facility] Group Notes (Signed)
  [facility] LCSW Group Therapy Note   Type of Therapy and Topic:  Group Therapy:  Feelings About Hospitalization  Participation Level:  Did Not Attend   Description of Group This process group involved patients discussing their feelings related to being hospitalized, as well as the benefits they see to being in the hospital.  These feelings and benefits were itemized.  The group then brainstormed specific ways in which they could seek those same benefits when they discharge and return home.  Therapeutic Goals 1. Patient will identify and describe positive and negative feelings related to hospitalization 2. Patient will verbalize benefits of hospitalization to themselves personally 3. Patients will brainstorm together ways they can obtain similar benefits in the outpatient setting, identify barriers to wellness and possible solutions  Summary of Patient Progress:  Did Not Attend  Therapeutic Modalities Cognitive Behavioral Therapy Motivational Interviewing       

## 2020-07-15 NOTE — Progress Notes (Signed)
Recreation Therapy Notes  Date:  6.3.22 Time: 0930 Location: [address] Dayroom  Group Topic: Stress Management  Goal Area(s) Addresses:  Patient will identify positive stress management techniques. Patient will identify benefits of using stress management post d/c.  Intervention: Stress Management   Activity: Meditation.  LRT played a meditation that focused on looking at each day as a new opportunity to start your day on a good note.    Education:  Stress Management, Discharge Planning.   Education Outcome: Acknowledges Education  Clinical Observations/Feedback: Pt did not attend group session.    Caroll Rancher, LRT/CTRS         Caroll Rancher A 07/15/2020 12:27 PM

## 2020-07-15 NOTE — Progress Notes (Signed)
Pt did not attend group goals group.

## 2021-11-30 IMAGING — CT CT CERVICAL SPINE W/O CM
4 series · 15 of 33 positions shown, 18 images · non-contrast
Comparison: CT head 04/10/2019

CLINICAL DATA: Facial trauma.  Delirium.

EXAM:
CT HEAD WITHOUT CONTRAST
CT CERVICAL SPINE WITHOUT CONTRAST
TECHNIQUE: Multidetector CT imaging of the head and cervical spine was
performed following the standard protocol without intravenous
contrast. Multiplanar CT image reconstructions of the cervical spine
were also generated.

[Series 5: c spine soft · axial · 0.34mm/px · z∈[-274,-240]mm · 2 of 103 slices shown]
[im 18/103  soft-tissue]
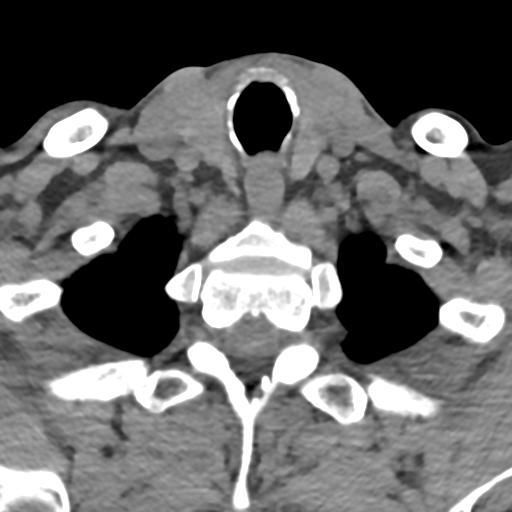
[im 35/103  soft-tissue]
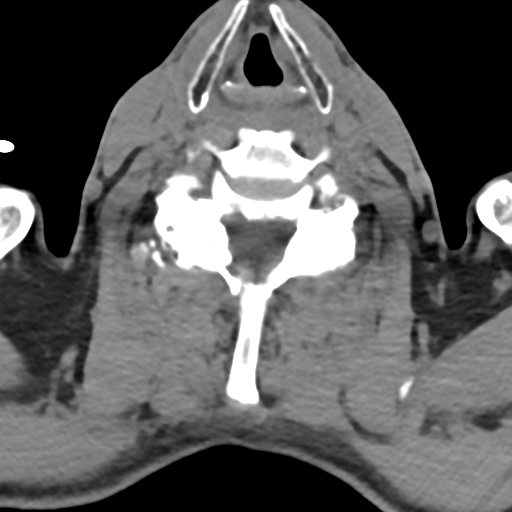

[Series 8: sag bone · sagittal · 0.42mm/px · 5 of 87 slices shown, 6 images]
[im 29/87  bone]
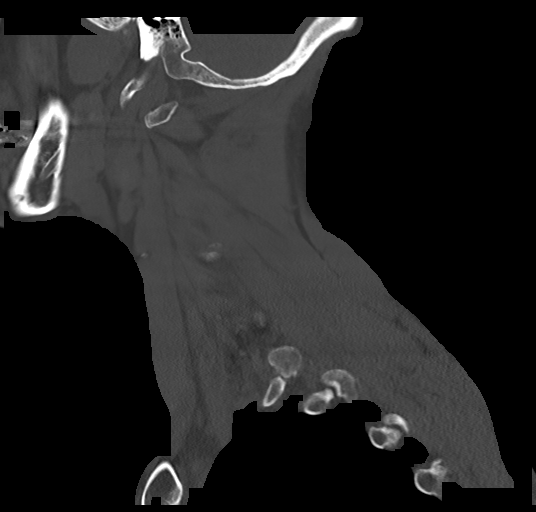
[im 36/87  bone]
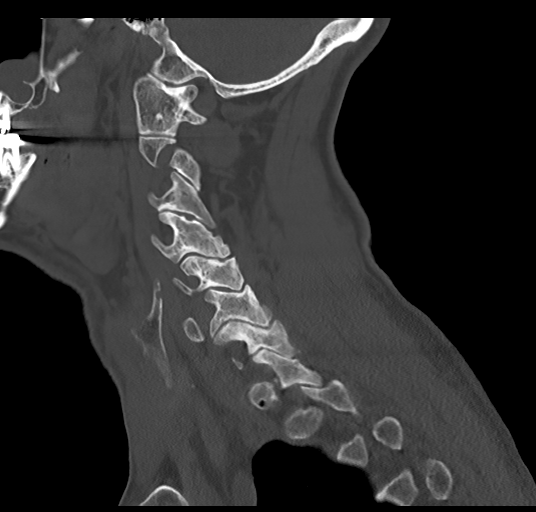
[im 44/87  soft-tissue]
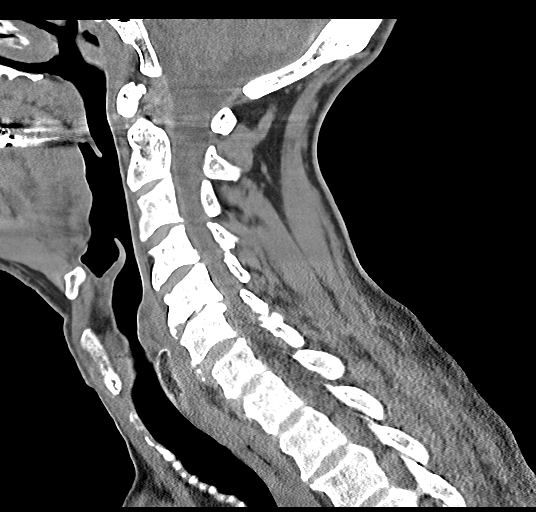
[im 44/87  bone]
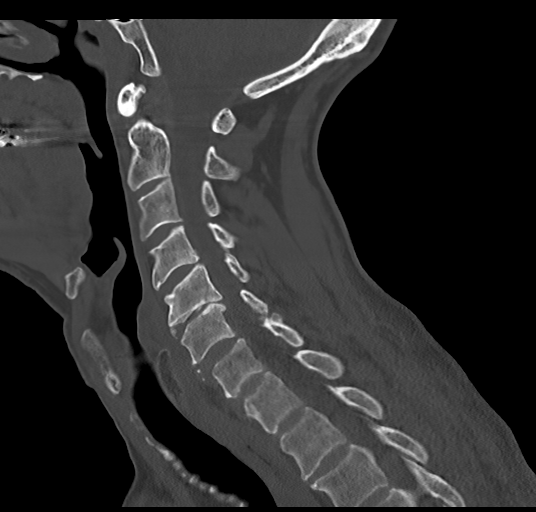
[im 51/87  bone]
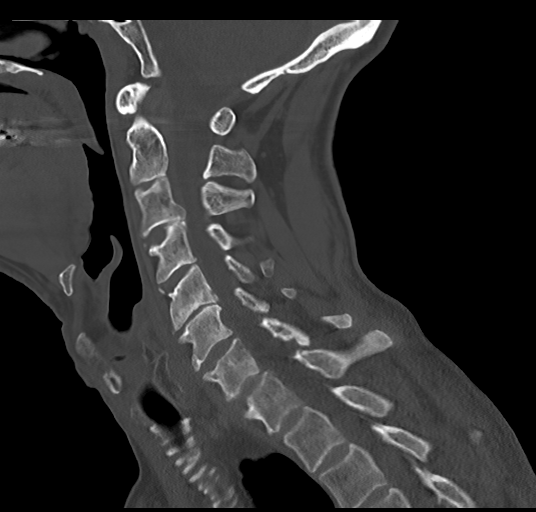
[im 58/87  bone]
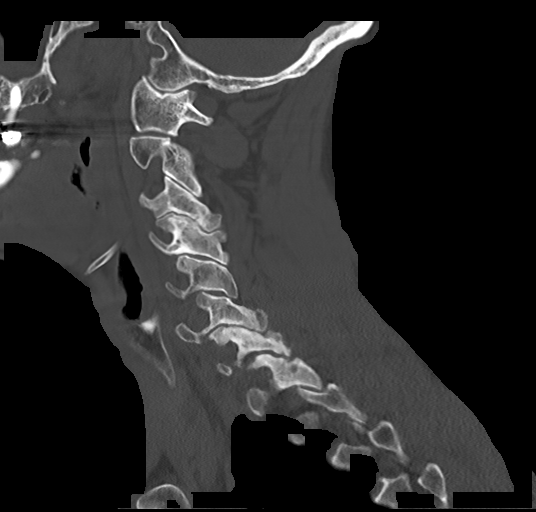

[Series 9: cor bone · coronal · 0.34mm/px · 3 of 113 slices shown]
[im 23/113  bone]
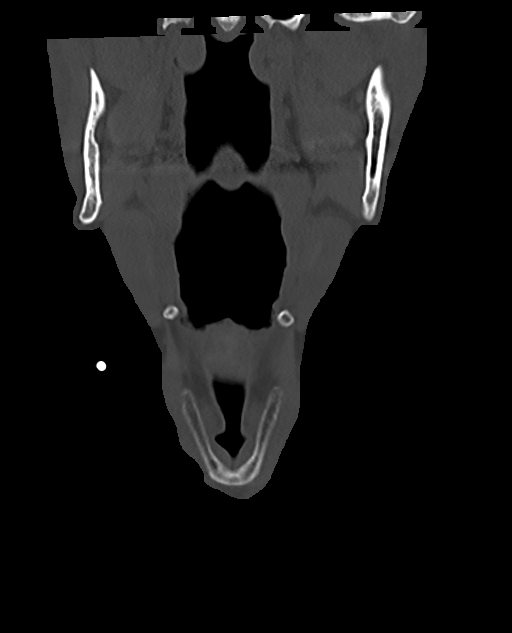
[im 45/113  bone]
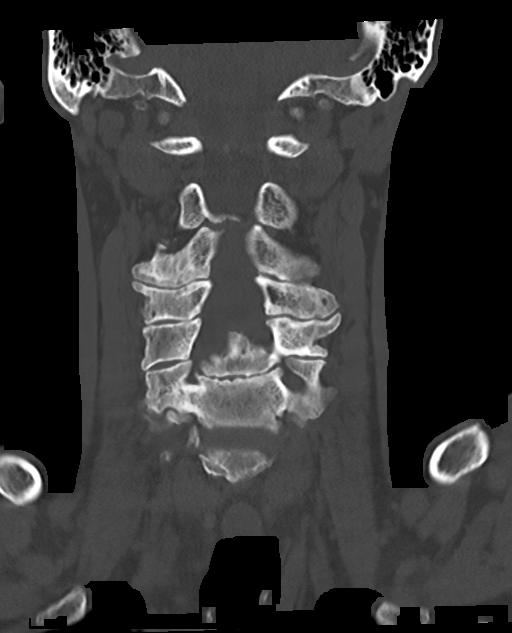
[im 68/113  bone]
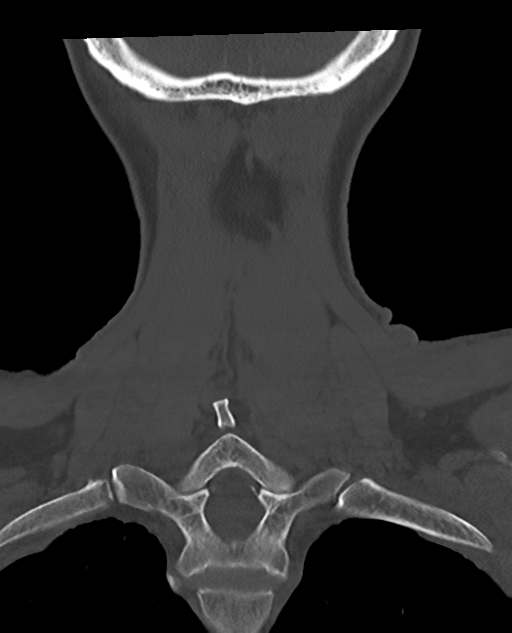

[Series 10: orthogonal axials · oblique · 0.21mm/px · 5 of 108 slices shown, 7 images]
[im 18/108  soft-tissue]
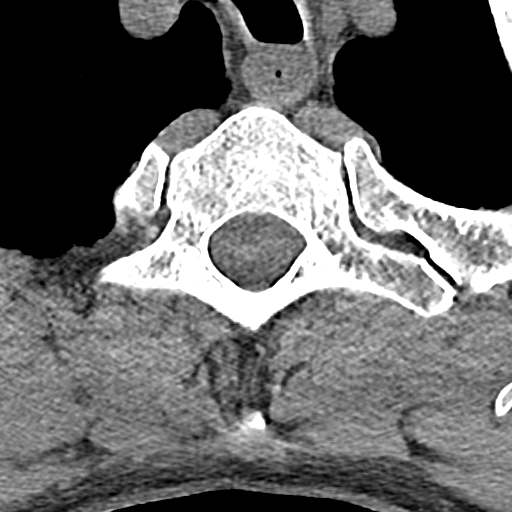
[im 18/108  bone]
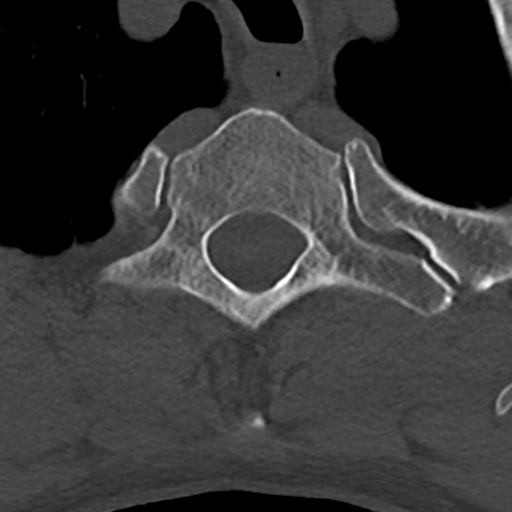
[im 36/108  bone]
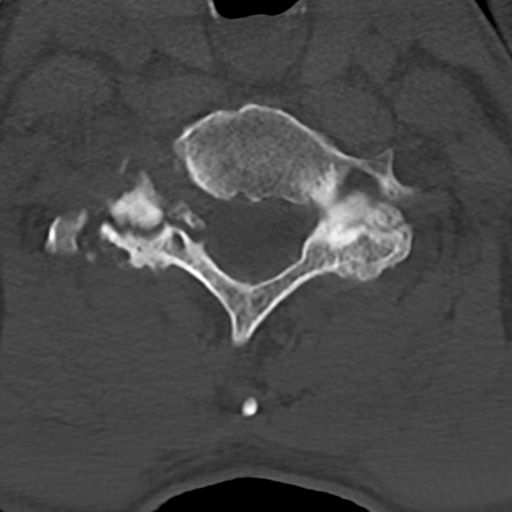
[im 54/108  bone]
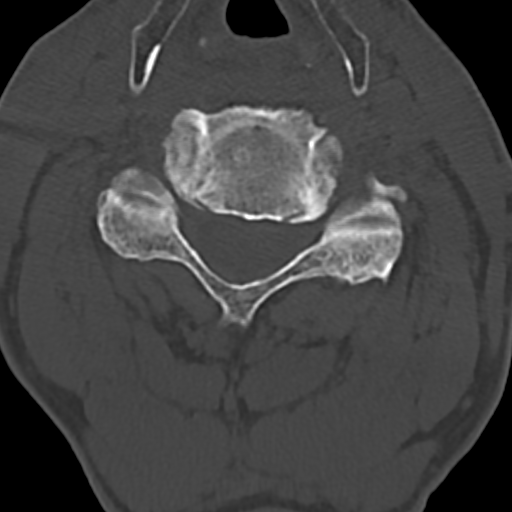
[im 72/108  bone]
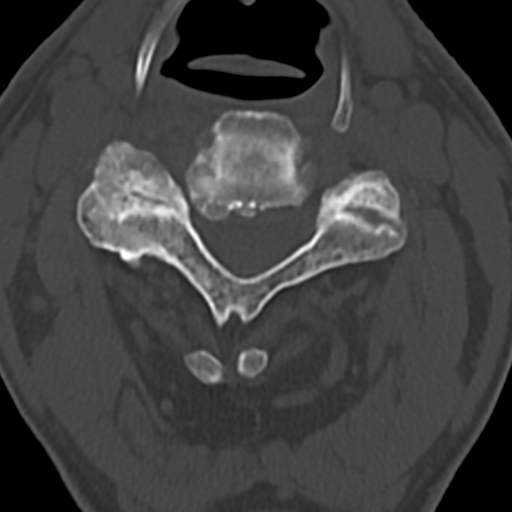
[im 90/108  soft-tissue]
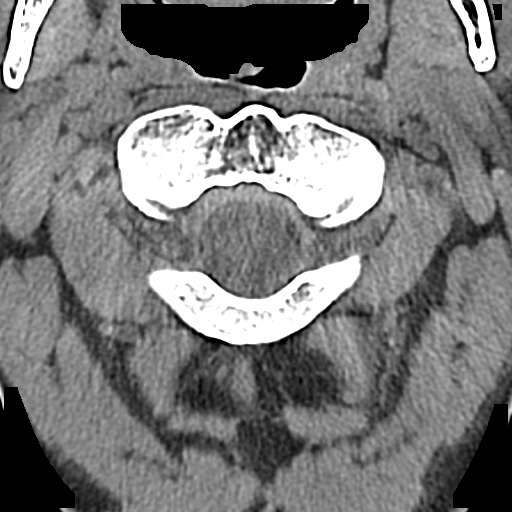
[im 90/108  bone]
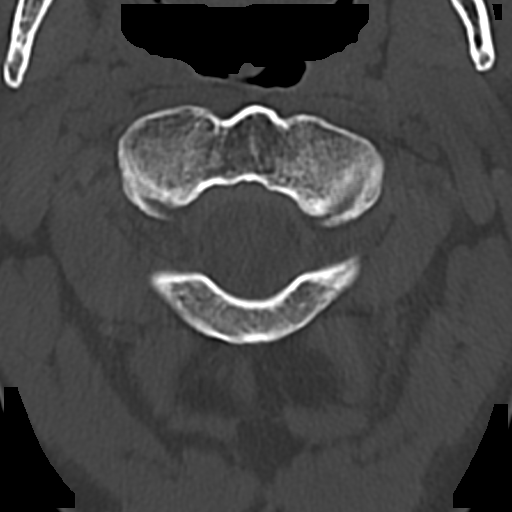

[15 of 33 positions shown; findings below may reference images not displayed]

FINDINGS: CT HEAD FINDINGS

Brain: No evidence of acute infarction, hemorrhage, hydrocephalus,
extra-axial collection or mass lesion/mass effect. Diffuse cortical
atrophy. Periventricular white matter hypodensity is a nonspecific
finding, but most commonly relates to chronic ischemic small vessel
disease.

Vascular: No hyperdense vessel or unexpected calcification.

Skull: Normal. Negative for fracture or focal lesion.

Sinuses/Orbits: Partial opacification of the maxillary sinuses.

Other: None.

CT CERVICAL SPINE FINDINGS

Alignment: Within normal limits.

Skull base and vertebrae: No acute fracture. No primary bone lesion
or focal pathologic process.

Soft tissues and spinal canal: No prevertebral fluid or swelling. No
visible canal hematoma.

Disc levels: Mild disc height loss at C5-C6. Advanced facet
degenerative changes seen throughout the cervical spine.
Degenerative changes are seen throughout the cervical spine with
greatest disease burden at C6-C7 with posterior spondylotic ridge
and bulky facet arthropathy causing severe bilateral foraminal
stenosis.

Upper chest: Mild emphysematous changes seen in the upper lungs.

Other: None
IMPRESSION: 1. No acute intracranial abnormality.
2. No acute fracture or dislocation of the cervical and visualized
upper thoracic spine.
3. Advanced multilevel degenerative changes of the cervical spine.

## 2021-11-30 IMAGING — DX DG PELVIS 1-2V
1 series · 1 of 1 positions shown · non-contrast
Comparison: None.

CLINICAL DATA: Suicide attempt.  Lacerations.

EXAM:
PELVIS - 1-2 VIEW

[x pelvis]
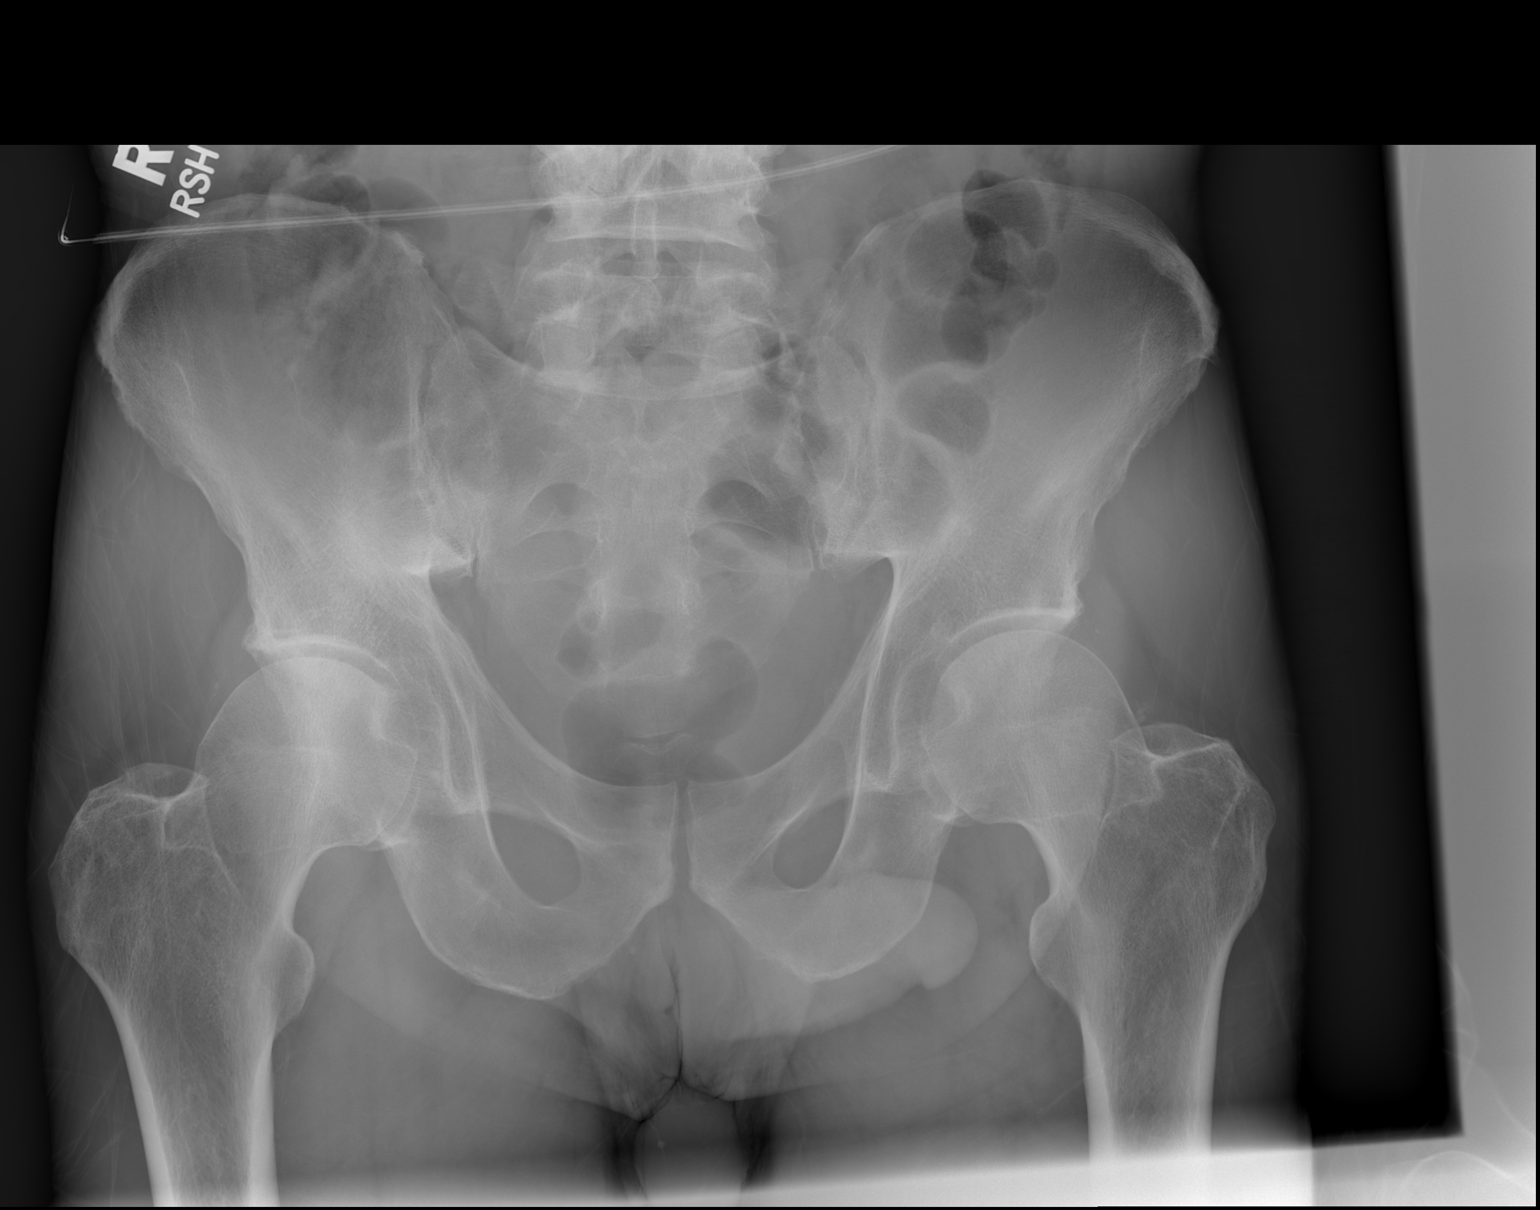

[1 of 1 positions shown; findings below may reference images not displayed]

FINDINGS: There is no evidence of pelvic fracture or diastasis. No pelvic bone
lesions are seen.
IMPRESSION: Negative.
